# Patient Record
Sex: Female | Born: 1955 | Race: White | Hispanic: No | Smoking: Never smoker
Health system: Southern US, Community
[De-identification: ages and names within clinical notes are randomized; demographics above are authoritative.]

## PROBLEM LIST (undated history)

## (undated) DIAGNOSIS — M3500 Sicca syndrome, unspecified: Secondary | ICD-10-CM

## (undated) DIAGNOSIS — M797 Fibromyalgia: Secondary | ICD-10-CM

## (undated) DIAGNOSIS — Z889 Allergy status to unspecified drugs, medicaments and biological substances status: Secondary | ICD-10-CM

## (undated) HISTORY — PX: TONSILLECTOMY: SUR1361

## (undated) HISTORY — PX: ABDOMINAL HYSTERECTOMY: SHX81

---

## 2004-09-07 ENCOUNTER — Ambulatory Visit: Payer: Self-pay | Admitting: Family Medicine

## 2004-09-13 ENCOUNTER — Ambulatory Visit: Payer: Self-pay | Admitting: Family Medicine

## 2004-10-01 HISTORY — PX: BREAST LUMPECTOMY: SHX2

## 2004-11-08 ENCOUNTER — Ambulatory Visit: Payer: Self-pay | Admitting: Family Medicine

## 2005-01-23 ENCOUNTER — Other Ambulatory Visit: Admission: RE | Admit: 2005-01-23 | Discharge: 2005-01-23 | Payer: Self-pay | Admitting: Obstetrics and Gynecology

## 2005-02-20 ENCOUNTER — Encounter: Admission: RE | Admit: 2005-02-20 | Discharge: 2005-02-20 | Payer: Self-pay | Admitting: Obstetrics and Gynecology

## 2005-02-28 ENCOUNTER — Encounter: Admission: RE | Admit: 2005-02-28 | Discharge: 2005-02-28 | Payer: Self-pay | Admitting: Obstetrics and Gynecology

## 2005-04-19 ENCOUNTER — Ambulatory Visit (HOSPITAL_COMMUNITY): Admission: RE | Admit: 2005-04-19 | Discharge: 2005-04-19 | Payer: Self-pay | Admitting: Surgery

## 2005-04-19 ENCOUNTER — Ambulatory Visit (HOSPITAL_BASED_OUTPATIENT_CLINIC_OR_DEPARTMENT_OTHER): Admission: RE | Admit: 2005-04-19 | Discharge: 2005-04-19 | Payer: Self-pay | Admitting: Surgery

## 2005-04-19 ENCOUNTER — Encounter: Admission: RE | Admit: 2005-04-19 | Discharge: 2005-04-19 | Payer: Self-pay | Admitting: Surgery

## 2005-04-19 ENCOUNTER — Encounter (INDEPENDENT_AMBULATORY_CARE_PROVIDER_SITE_OTHER): Payer: Self-pay | Admitting: *Deleted

## 2005-10-08 ENCOUNTER — Encounter: Admission: RE | Admit: 2005-10-08 | Discharge: 2005-10-08 | Payer: Self-pay | Admitting: Obstetrics and Gynecology

## 2005-12-18 ENCOUNTER — Ambulatory Visit: Payer: Self-pay | Admitting: Family Medicine

## 2005-12-20 ENCOUNTER — Ambulatory Visit: Payer: Self-pay | Admitting: Cardiology

## 2006-03-05 ENCOUNTER — Encounter: Admission: RE | Admit: 2006-03-05 | Discharge: 2006-03-05 | Payer: Self-pay | Admitting: Obstetrics and Gynecology

## 2006-03-19 ENCOUNTER — Ambulatory Visit: Payer: Self-pay | Admitting: Internal Medicine

## 2006-03-22 ENCOUNTER — Ambulatory Visit (HOSPITAL_COMMUNITY): Admission: RE | Admit: 2006-03-22 | Discharge: 2006-03-22 | Payer: Self-pay | Admitting: Internal Medicine

## 2006-04-12 ENCOUNTER — Ambulatory Visit: Payer: Self-pay | Admitting: Internal Medicine

## 2006-06-17 ENCOUNTER — Encounter: Admission: RE | Admit: 2006-06-17 | Discharge: 2006-06-17 | Payer: Self-pay | Admitting: Family Medicine

## 2006-06-17 ENCOUNTER — Ambulatory Visit: Payer: Self-pay | Admitting: Family Medicine

## 2006-08-30 ENCOUNTER — Other Ambulatory Visit: Admission: RE | Admit: 2006-08-30 | Discharge: 2006-08-30 | Payer: Self-pay | Admitting: Obstetrics and Gynecology

## 2006-09-03 ENCOUNTER — Ambulatory Visit: Payer: Self-pay | Admitting: Family Medicine

## 2006-12-13 ENCOUNTER — Ambulatory Visit: Payer: Self-pay | Admitting: Family Medicine

## 2006-12-13 LAB — CONVERTED CEMR LAB
Eosinophils Relative: 2.7 % (ref 0.0–5.0)
HCT: 40.6 % (ref 36.0–46.0)
MCHC: 34.8 g/dL (ref 30.0–36.0)
MCV: 89.4 fL (ref 78.0–100.0)
Monocytes Relative: 12.2 % — ABNORMAL HIGH (ref 3.0–11.0)
Neutro Abs: 4.2 10*3/uL (ref 1.4–7.7)
Neutrophils Relative %: 60.7 % (ref 43.0–77.0)
TSH: 1.6 microintl units/mL (ref 0.35–5.50)

## 2007-01-21 ENCOUNTER — Ambulatory Visit: Payer: Self-pay | Admitting: Family Medicine

## 2007-03-07 ENCOUNTER — Encounter: Admission: RE | Admit: 2007-03-07 | Discharge: 2007-03-07 | Payer: Self-pay | Admitting: Obstetrics and Gynecology

## 2007-04-11 ENCOUNTER — Ambulatory Visit (HOSPITAL_COMMUNITY): Admission: RE | Admit: 2007-04-11 | Discharge: 2007-04-11 | Payer: Self-pay | Admitting: Obstetrics and Gynecology

## 2007-04-11 ENCOUNTER — Encounter (INDEPENDENT_AMBULATORY_CARE_PROVIDER_SITE_OTHER): Payer: Self-pay | Admitting: Obstetrics and Gynecology

## 2007-05-22 DIAGNOSIS — H9319 Tinnitus, unspecified ear: Secondary | ICD-10-CM | POA: Insufficient documentation

## 2007-05-22 DIAGNOSIS — M26609 Unspecified temporomandibular joint disorder, unspecified side: Secondary | ICD-10-CM | POA: Insufficient documentation

## 2007-05-22 DIAGNOSIS — Z87442 Personal history of urinary calculi: Secondary | ICD-10-CM | POA: Insufficient documentation

## 2007-05-22 DIAGNOSIS — IMO0001 Reserved for inherently not codable concepts without codable children: Secondary | ICD-10-CM | POA: Insufficient documentation

## 2007-05-22 DIAGNOSIS — M1990 Unspecified osteoarthritis, unspecified site: Secondary | ICD-10-CM | POA: Insufficient documentation

## 2007-05-22 DIAGNOSIS — Z87898 Personal history of other specified conditions: Secondary | ICD-10-CM | POA: Insufficient documentation

## 2007-05-22 DIAGNOSIS — E785 Hyperlipidemia, unspecified: Secondary | ICD-10-CM | POA: Insufficient documentation

## 2007-05-22 DIAGNOSIS — L738 Other specified follicular disorders: Secondary | ICD-10-CM | POA: Insufficient documentation

## 2007-05-22 DIAGNOSIS — IMO0002 Reserved for concepts with insufficient information to code with codable children: Secondary | ICD-10-CM | POA: Insufficient documentation

## 2007-05-27 ENCOUNTER — Emergency Department (HOSPITAL_COMMUNITY): Admission: EM | Admit: 2007-05-27 | Discharge: 2007-05-27 | Payer: Self-pay | Admitting: Emergency Medicine

## 2007-05-29 ENCOUNTER — Ambulatory Visit: Payer: Self-pay | Admitting: Family Medicine

## 2007-05-29 DIAGNOSIS — S335XXA Sprain of ligaments of lumbar spine, initial encounter: Secondary | ICD-10-CM | POA: Insufficient documentation

## 2007-05-29 DIAGNOSIS — M542 Cervicalgia: Secondary | ICD-10-CM | POA: Insufficient documentation

## 2007-06-13 ENCOUNTER — Ambulatory Visit: Payer: Self-pay | Admitting: Family Medicine

## 2007-06-13 DIAGNOSIS — N949 Unspecified condition associated with female genital organs and menstrual cycle: Secondary | ICD-10-CM | POA: Insufficient documentation

## 2007-06-19 ENCOUNTER — Encounter: Admission: RE | Admit: 2007-06-19 | Discharge: 2007-07-21 | Payer: Self-pay | Admitting: Family Medicine

## 2007-06-19 ENCOUNTER — Encounter: Payer: Self-pay | Admitting: Family Medicine

## 2007-06-19 ENCOUNTER — Telehealth: Payer: Self-pay | Admitting: Family Medicine

## 2007-06-24 ENCOUNTER — Encounter: Payer: Self-pay | Admitting: Family Medicine

## 2007-07-31 ENCOUNTER — Ambulatory Visit: Payer: Self-pay | Admitting: Obstetrics & Gynecology

## 2007-08-04 ENCOUNTER — Ambulatory Visit (HOSPITAL_COMMUNITY): Admission: RE | Admit: 2007-08-04 | Discharge: 2007-08-04 | Payer: Self-pay | Admitting: Gynecology

## 2007-08-14 ENCOUNTER — Ambulatory Visit: Payer: Self-pay | Admitting: Nurse Practitioner

## 2007-08-25 ENCOUNTER — Encounter: Payer: Self-pay | Admitting: Family Medicine

## 2007-08-25 ENCOUNTER — Ambulatory Visit (HOSPITAL_COMMUNITY): Admission: RE | Admit: 2007-08-25 | Discharge: 2007-08-25 | Payer: Self-pay | Admitting: Gynecology

## 2007-10-21 ENCOUNTER — Encounter: Payer: Self-pay | Admitting: Family Medicine

## 2007-11-12 ENCOUNTER — Ambulatory Visit: Payer: Self-pay | Admitting: Family Medicine

## 2007-11-19 ENCOUNTER — Ambulatory Visit: Payer: Self-pay | Admitting: Family Medicine

## 2007-11-19 DIAGNOSIS — R9409 Abnormal results of other function studies of central nervous system: Secondary | ICD-10-CM | POA: Insufficient documentation

## 2007-11-25 ENCOUNTER — Ambulatory Visit: Payer: Self-pay | Admitting: Family Medicine

## 2007-11-25 DIAGNOSIS — R300 Dysuria: Secondary | ICD-10-CM | POA: Insufficient documentation

## 2007-11-25 LAB — CONVERTED CEMR LAB
Bilirubin Urine: NEGATIVE
Ketones, urine, test strip: NEGATIVE
Nitrite: NEGATIVE
Specific Gravity, Urine: 1.015
WBC Urine, dipstick: NEGATIVE

## 2007-11-26 ENCOUNTER — Encounter: Payer: Self-pay | Admitting: Family Medicine

## 2007-11-26 LAB — CONVERTED CEMR LAB
AST: 19 units/L (ref 0–37)
Albumin: 4 g/dL (ref 3.5–5.2)
Basophils Relative: 1.2 % — ABNORMAL HIGH (ref 0.0–1.0)
Bilirubin, Direct: 0.1 mg/dL (ref 0.0–0.3)
Chloride: 104 meq/L (ref 96–112)
Cholesterol: 231 mg/dL (ref 0–200)
Creatinine, Ser: 1 mg/dL (ref 0.4–1.2)
Direct LDL: 161.2 mg/dL
Eosinophils Absolute: 0.2 10*3/uL (ref 0.0–0.6)
Eosinophils Relative: 4.5 % (ref 0.0–5.0)
Monocytes Relative: 10.7 % (ref 3.0–11.0)
Neutrophils Relative %: 54.1 % (ref 43.0–77.0)
Platelets: 207 10*3/uL (ref 150–400)
Sodium: 139 meq/L (ref 135–145)
TSH: 1.91 microintl units/mL (ref 0.35–5.50)
Total Bilirubin: 1.1 mg/dL (ref 0.3–1.2)
Total Protein: 6.8 g/dL (ref 6.0–8.3)
WBC: 4.5 10*3/uL (ref 4.5–10.5)

## 2007-12-03 LAB — CONVERTED CEMR LAB

## 2007-12-05 ENCOUNTER — Telehealth: Payer: Self-pay | Admitting: Family Medicine

## 2008-01-19 ENCOUNTER — Telehealth: Payer: Self-pay | Admitting: Family Medicine

## 2008-01-19 DIAGNOSIS — R948 Abnormal results of function studies of other organs and systems: Secondary | ICD-10-CM | POA: Insufficient documentation

## 2008-01-19 DIAGNOSIS — G43009 Migraine without aura, not intractable, without status migrainosus: Secondary | ICD-10-CM | POA: Insufficient documentation

## 2008-01-21 ENCOUNTER — Ambulatory Visit: Payer: Self-pay | Admitting: Family Medicine

## 2008-01-26 LAB — CONVERTED CEMR LAB: Vit D, 1,25-Dihydroxy: 26 — ABNORMAL LOW (ref 30–89)

## 2008-04-12 ENCOUNTER — Encounter: Admission: RE | Admit: 2008-04-12 | Discharge: 2008-04-12 | Payer: Self-pay | Admitting: Family Medicine

## 2008-04-14 ENCOUNTER — Ambulatory Visit: Payer: Self-pay | Admitting: Obstetrics & Gynecology

## 2008-04-23 ENCOUNTER — Encounter: Payer: Self-pay | Admitting: Family Medicine

## 2008-05-18 ENCOUNTER — Telehealth (INDEPENDENT_AMBULATORY_CARE_PROVIDER_SITE_OTHER): Payer: Self-pay | Admitting: *Deleted

## 2008-05-19 ENCOUNTER — Ambulatory Visit: Payer: Self-pay | Admitting: Family Medicine

## 2008-05-20 LAB — CONVERTED CEMR LAB
AST: 24 units/L (ref 0–37)
HDL: 44.4 mg/dL (ref >39.0)
Total CHOL/HDL Ratio: 5

## 2008-07-15 IMAGING — CR DG HAND COMPLETE 3+V*L*
3 series · 3 of 3 positions shown · non-contrast
Comparison: none

CLINICAL DATA: Pain and swelling of the left third finger.  
LEFT THIRD FINGER:
There is no acute or significant abnormality.  There is a small bone cyst involving the distal aspect of the middle phalanx of the third finger.  This is only about 2 mm in size.  I think this is an incidental finding, unlikely to be clinically significant.  Soft tissues unremarkable.

[view not recorded (1 of 3)]
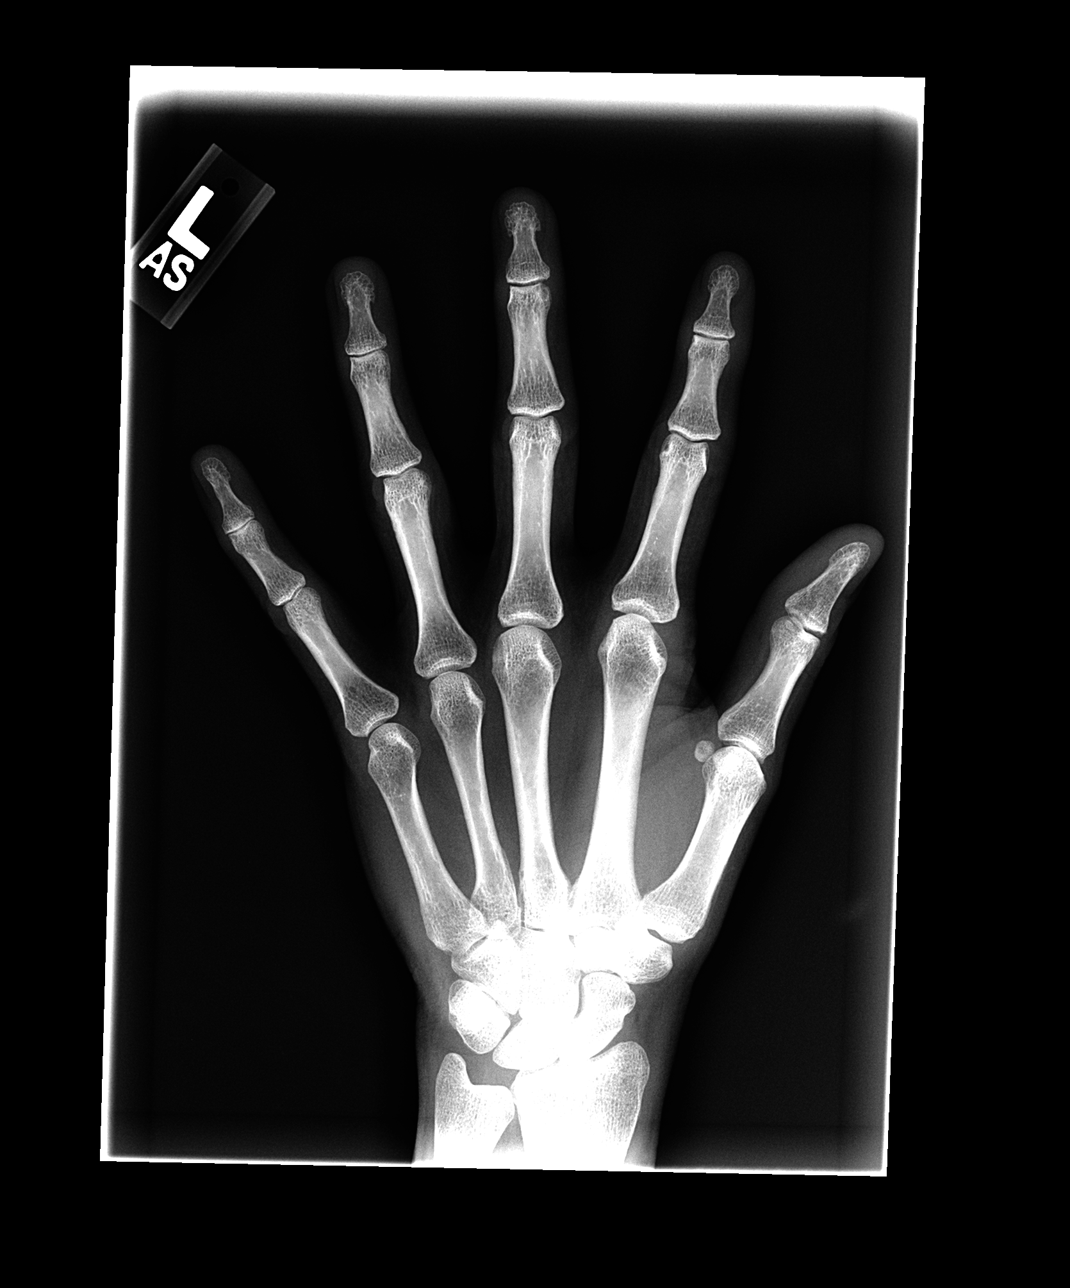

[view not recorded (2 of 3)]
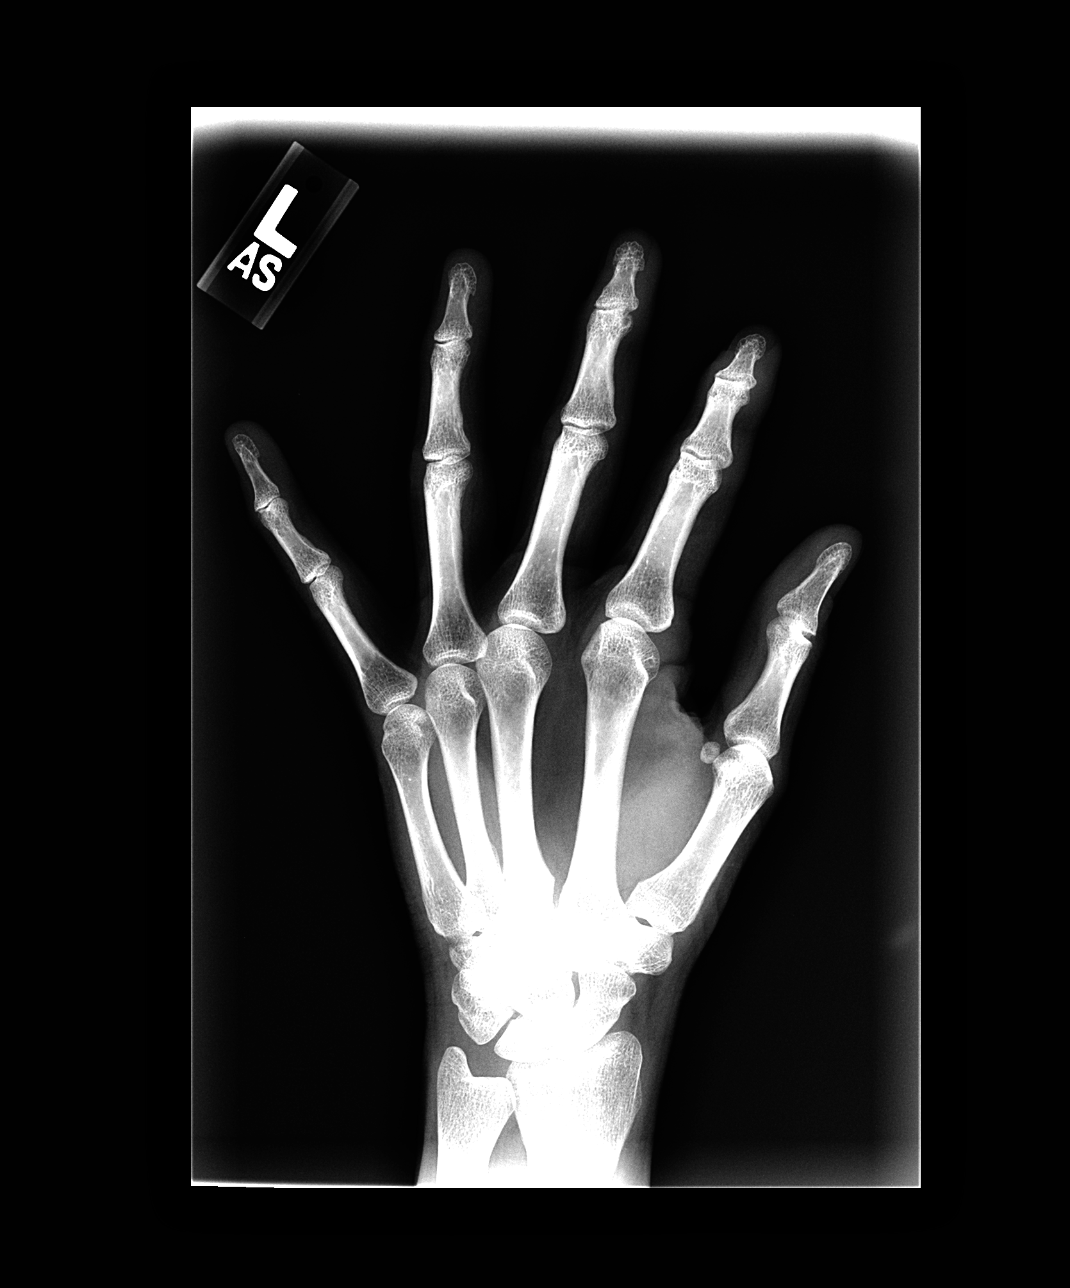

[view not recorded (3 of 3)]
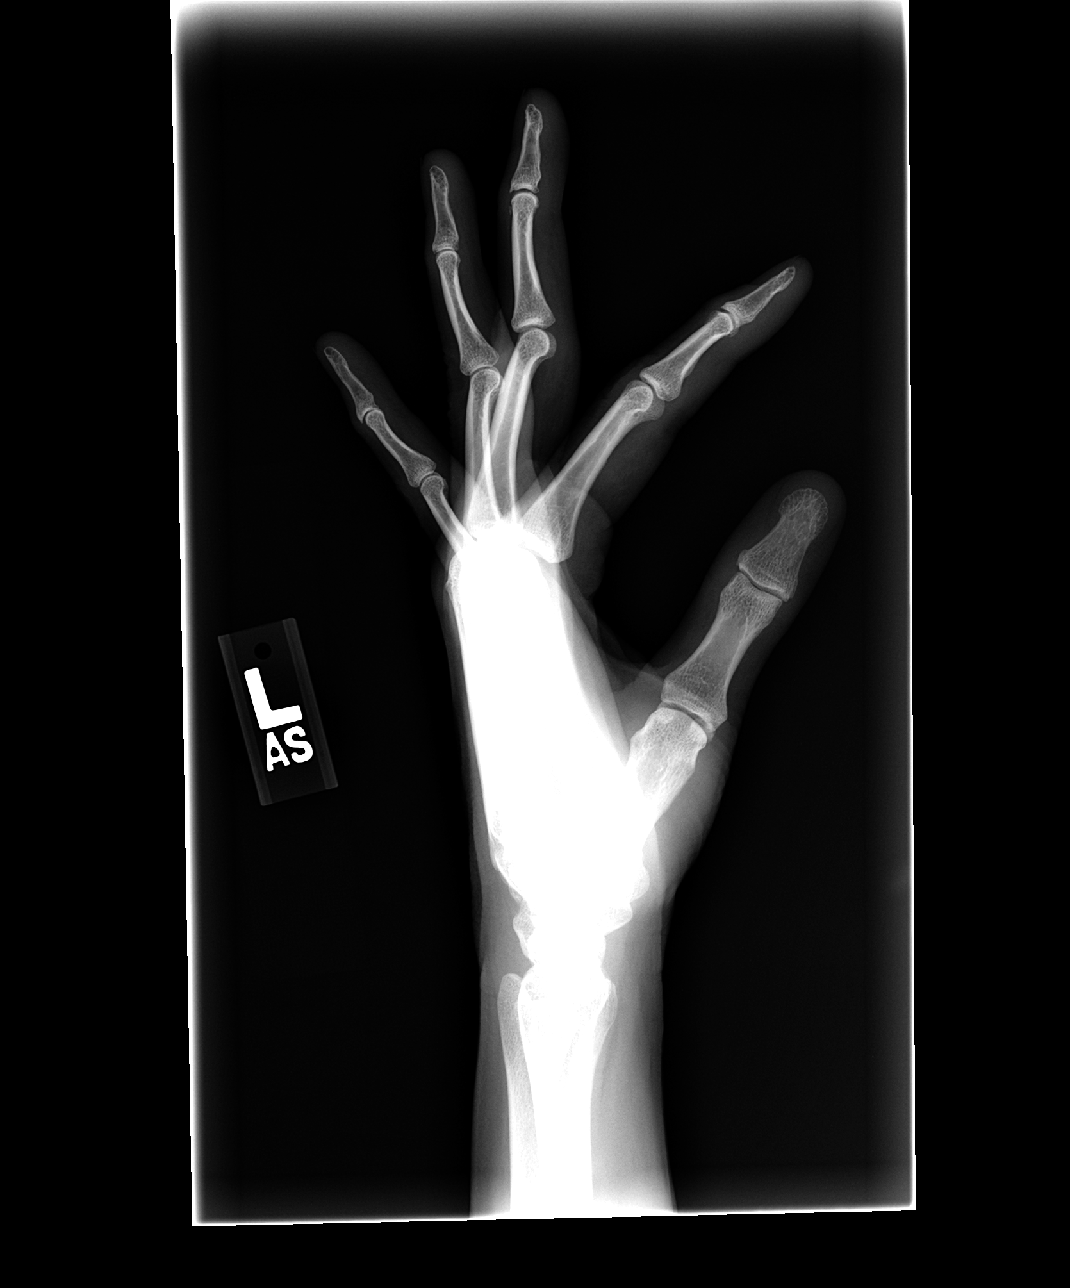

[3 of 3 positions shown; findings below may reference images not displayed]

IMPRESSION: No acute or significant findings.  There is a 2 mm bone cyst involving the distal aspect of the middle phalanx of the third digit.

## 2008-10-26 ENCOUNTER — Telehealth (INDEPENDENT_AMBULATORY_CARE_PROVIDER_SITE_OTHER): Payer: Self-pay | Admitting: *Deleted

## 2008-11-10 ENCOUNTER — Ambulatory Visit: Payer: Self-pay | Admitting: Family Medicine

## 2008-11-10 DIAGNOSIS — E559 Vitamin D deficiency, unspecified: Secondary | ICD-10-CM | POA: Insufficient documentation

## 2008-11-11 ENCOUNTER — Encounter: Payer: Self-pay | Admitting: Family Medicine

## 2008-11-15 LAB — CONVERTED CEMR LAB
Albumin: 4.1 g/dL (ref 3.5–5.2)
BUN: 9 mg/dL (ref 6–23)
Bilirubin, Direct: 0.1 mg/dL (ref 0.0–0.3)
CO2: 30 meq/L (ref 19–32)
Cholesterol: 250 mg/dL (ref 0–200)
Direct LDL: 176.4 mg/dL
GFR calc Af Amer: 75 mL/min
GFR calc non Af Amer: 62 mL/min
Potassium: 4.7 meq/L (ref 3.5–5.1)
Sodium: 140 meq/L (ref 135–145)
Triglycerides: 142 mg/dL (ref 0–149)
Vit D, 1,25-Dihydroxy: 58 (ref 30–89)

## 2008-11-16 ENCOUNTER — Telehealth: Payer: Self-pay | Admitting: Family Medicine

## 2008-11-23 ENCOUNTER — Telehealth: Payer: Self-pay | Admitting: Family Medicine

## 2008-12-06 ENCOUNTER — Ambulatory Visit: Payer: Self-pay | Admitting: Internal Medicine

## 2008-12-20 ENCOUNTER — Ambulatory Visit: Payer: Self-pay | Admitting: Internal Medicine

## 2009-02-01 ENCOUNTER — Ambulatory Visit: Payer: Self-pay | Admitting: Family Medicine

## 2009-02-01 ENCOUNTER — Telehealth: Payer: Self-pay | Admitting: Family Medicine

## 2009-02-01 DIAGNOSIS — L089 Local infection of the skin and subcutaneous tissue, unspecified: Secondary | ICD-10-CM | POA: Insufficient documentation

## 2009-02-16 ENCOUNTER — Ambulatory Visit: Payer: Self-pay | Admitting: Family Medicine

## 2009-02-18 LAB — CONVERTED CEMR LAB
Cholesterol: 165 mg/dL (ref 0–200)
HDL: 42.6 mg/dL (ref >39.00)
Triglycerides: 138 mg/dL (ref 0.0–149.0)

## 2009-03-15 ENCOUNTER — Emergency Department (HOSPITAL_COMMUNITY): Admission: EM | Admit: 2009-03-15 | Discharge: 2009-03-15 | Payer: Self-pay | Admitting: Emergency Medicine

## 2009-05-05 ENCOUNTER — Encounter: Admission: RE | Admit: 2009-05-05 | Discharge: 2009-05-05 | Payer: Self-pay | Admitting: Family Medicine

## 2009-10-01 HISTORY — PX: ANKLE SURGERY: SHX546

## 2010-10-31 NOTE — Progress Notes (Signed)
Summary: [facility]/Connie Dupree PT  [facility]/Connie Dupree PT   Imported By: Eleonore Chiquito 09/01/2007 15:04:43  _____________________________________________________________________  External Attachment:    Type:   Image     Comment:   External Document

## 2011-02-13 NOTE — Assessment & Plan Note (Signed)
NAMEAUDRIE, Rebecca Berger                ACCOUNT NO.:  [id number]   MEDICAL RECORD NO.:  [id number]          PATIENT TYPE:  POB   LOCATION:  [facility]         FACILITY:  [facility]   PHYSICIAN:  Tinnie Gens, MD        DATE OF BIRTH:  17-Oct-1955   DATE OF SERVICE:  11/10/2008                                  CLINIC NOTE   CHIEF COMPLAINT:  Vulvar irritation.   HISTORY OF PRESENT ILLNESS:  The patient is a 55 year old who has  undergone hysterectomy in the past.  She has a history of fibromyalgia  and migraine headaches.  She is menopausal and is on Climara or Vivelle-  Dot.  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  The patient had no biopsy prior  to having this procedure done.  Her past and surgery reports are  reviewed.  She comes back with a couple of growths that have just  started and mildly irritating to her; however, she is not interested in  major surgery again.   PHYSICAL EXAMINATION:  VITAL SIGNS:  On exam today, her vitals are as in  the chart.  GENERAL:  She is a well-developed, well-nourished female in no acute  distress.  GU:  Normal external female genitalia.  She does have a mild thickening  of the left labia majora.  Her labia minora is very small and is white.  There is a couple of other nevi and possible acrochordons noted in the  groin area.   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   PLAN:  Lots of reassurance given to this patient.  There is really no  need to do anything unless they get big enough or they are irritating  given the benign process.  Should she require removal shaving,  desiccation, freezing, any other activity done easily and without too  much trauma, she certainly would not require skinning vulvectomies.  She  will follow up with [place] as needed when she desires further treatment.          ______________________________  Tinnie Gens, MD     TP/MEDQ  D:  11/10/2008  T:  11/10/2008  Job:  [id number]

## 2011-02-13 NOTE — Op Note (Signed)
Rebecca Berger, Rebecca Berger                ACCOUNT NO.:  [id number]   MEDICAL RECORD NO.:  [id number]          PATIENT TYPE:  AMB   LOCATION:  [facility]                           FACILITY:  [facility]   PHYSICIAN:  James A. Ashley Royalty, M.D.DATE OF BIRTH:  1956/09/27   DATE OF PROCEDURE:  04/11/2007  DATE OF DISCHARGE:                               OPERATIVE REPORT   PREOPERATIVE DIAGNOSIS:  1. 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  2. Papule of the right buttocks with desire for removal.   POSTOPERATIVE DIAGNOSIS:  1. 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  2. Papule of the right buttocks with desire for removal.   PROCEDURE:  1. Partial vulvectomy  2. Excision of papule of the right buttock.   SURGEON:  Rudy Jew. Ashley Royalty, M.D.   ANESTHESIA:  General.   ESTIMATED BLOOD LOSS:  Less than 25 mL.   COMPLICATIONS:  None.   PACKS AND DRAINS:  None.   PROCEDURE IN DETAIL:  The patient was taken to the operating room and  prepped and draped in the usual manner for vaginal surgery.  The  excrescences of the labia minora bilaterally were once again observed.  The papule of the right buttocks was once again observed.  The papule  was excised with a 15 blade using an elliptical incision.  The specimen  was submitted to pathology for histologic studies.  The defect was  easily closed with 3-0 Monocryl in a subcuticular fashion.   Attention was then turned to the partial vulvectomy.  The anterior  portion of the labia minora on the right was noted to contain  excrescences that came within approximately 1 to 1.5 cm from the  clitoris.  A portion of the labium in question was excised with  Metzenbaum scissors and submitted to pathology for histologic studies.  The anterior portion of the left labia minora was also involved with the  excrescences consistent with HPV.  The area in question was excised with  Metzenbaum scissors and submitted to pathology for histologic studies.  Hemostasis was obtained  with the Bovie.  The labia were closed on each side with a running  subcuticular suture of 3-0 chromic.  The edges were nicely approximated.  Hemostasis was noted and the procedure terminated.   The patient tolerated the procedure extremely well and was returned to  the recovery room in good condition.      James A. Ashley Royalty, M.D.  Electronically Signed     JAM/MEDQ  D:  04/11/2007  T:  04/12/2007  Job:  [id number]

## 2011-02-13 NOTE — Assessment & Plan Note (Signed)
NAMEMISKI, FELDPAUSCH NO.:  [id number]   MEDICAL RECORD NO.:  [id number]          PATIENT TYPE:  POB   LOCATION:  [facility]         FACILITY:  [facility]   PHYSICIAN:  Elsie Lincoln, MD      DATE OF BIRTH:  [birth year]   DATE OF SERVICE:  07/31/2007                                  CLINIC NOTE   Patient is a 55 year old para 1 female who has been menopausal since age  73 after a hysterectomy for endometriosis.  She has been on hormone  replacement patch since then.  She underwent menopausal symptoms for 5  months prior to going on hormone replacement therapy but recently she is  experiencing menopausal symptoms again with hot flashes and breast  tenderness.  The only explanation I could have for this is possibly  there is an ovarian remnant that she had left during the surgery, now  she is actually undergoing menopause.  Otherwise there needs to be  another explanation for the return of menopausal symptoms, possibly  thyroid or depression.  She denies either of these problems.  The  patient was an old patient of Dr. Ashley Royalty and she has recently had  removal tumors from her labia minora in July 2008 at the [facility] that were benign.  She also is suffering from migraine  headaches, she does have an aura with them where she has vision changes  and has actually had complete loss of vision at one time.  She has never  had imaging of her head which I think would be warranted at this time.  She does have primary care doctor, Dr. Milinda Antis, but I will refer her to  our headache specialist Bonita Quin who can hopefully help her with some of  her issues.  I also think that she should go off the Vivelle because she  has been on it for 19 years and we discussed the [facility]  initiative and the risk of breast cancer, and also that to being on  estrogen and possibly having neurologic symptoms from her migraines I do  not think this would be a good mix.   PAST MEDICAL  HISTORY:  1. Fibromyalgia.  2. Osteoarthritis.  3. Proteinuria as a child.  4. Mitral valve prolapse.  5. High cholesterol.   SURGICAL HISTORY:  1. Lumpectomy with benign breast lump.  2. Foot surgery 2006.  3. Hysterectomy 1990 in [place].  4. Removal of tumors in the labia minora that were benign.   FAMILY HISTORY:  Positive for dad having a heart attack and autoimmune  diseases in her family like lupus, rheumatoid arthritis, stroke,  Sjogren's syndrome and Alzheimer's.   GYN HISTORY:  She has had one miscarriage and one vaginal delivery.  She  has not ever had any abnormal Pap smears.  She had hysterectomy for  endometriosis.  She has never had ovarian cysts, fibroid tumors or  sexually transmitted diseases.   SOCIAL HISTORY:  She has lived in this area for several years and  recently moved from [place].  She lives with her husband, is a  monogamous relationship.  She rarely drinks alcohol and does not smoke  and does drink caffeinated beverages.  She does not use drugs or has not  been sexually abused.   SYSTEMIC REVIEW:  Positive for muscle aches which can be contributed to  her fibromyalgia, she has frequent headaches and problems with vision  which are related which is discussed above.  She has vaginal odor and  itching and was recently self-treated for yeast infection.   MEDICATIONS:  Vivelle-Dot and Lipitor.  She is going to self discontinue  the Lipitor and is discussing this with Dr. Milinda Antis.   SHE HAS MULTIPLE ALLERGIES TO CODEINE, MORPHINE SULFATE AND ALL DYES,  HAS PROBLEM WITH FILLER IN MEDICATIONS.   She is to take calcium however she has had problems with the fillers so  she stopped.  We did discuss that she does need to be on calcium given  that she is menopausal.  She is going to try to go to the organic food  store and get an organic vitamin and organic calcium.   Pulse 86, blood pressure 114/64, weight 157.5, height 5 foot 10.  GENERAL:   Well-nourished, well-developed, no apparent distress.  HEENT:  Normocephalic, atraumatic, good dentition.  Thyroid no masses.  LUNGS:  Clear to auscultation bilaterally.  HEART:  Regular rate and rhythm.  BREASTS:  No masses.  Left breast has evidence of a well-healed biopsy  site, no lymphadenopathy.  ABDOMEN:  Soft, nontender, no organomegaly, no hernia.  Well healed  __________ skin incision from hysterectomy.  GENITALIA:  Tanner 5, there is evidence of yeast on her vulva, labia  minora are well healed.  Urethra are bladder are well suspended.  Vagina  has good length.  There is evidence of yeast in the vagina as well,  vaginal cuff is nontender.  There is a mass midline to left that is  tender.  The patient does have problem with constipation however I do  not know if I am feeling stool in her bowel or if there is a mass so  pelvic ultrasound will be ordered.  EXTREMITIES:  No edema.   ASSESSMENT/PLAN:  A 55 year old female for yearly exam.  1. Pap smear not necessary, the patient has had hysterectomy.  No      history of dysplasia on her cervix.  2. Mammogram is up-to-date.  3. Calcium supplementation talked about.  4. Stop Vivelle and see how she does off the hormones.  5. Refer to migraines specialist.  6. Transvaginal ultrasound to see if there is any issues with any      masses in her pelvis.  7. Return to clinic in a week for results.           ______________________________  Elsie Lincoln, MD     KL/MEDQ  D:  07/31/2007  T:  07/31/2007  Job:  [id number]

## 2011-02-13 NOTE — Assessment & Plan Note (Signed)
NAMEGURNOOR, SLOOP NO.:  [id number]   MEDICAL RECORD NO.:  [id number]          PATIENT TYPE:  POB   LOCATION:  [facility]         FACILITY:  [facility]   PHYSICIAN:  Elsie Lincoln, MD      DATE OF BIRTH:  09-05-1956   DATE OF SERVICE:  04/14/2008                                  CLINIC NOTE   The patient is a 55 year old female who presents for complaints of  vaginal discharge and pain.  The patient is self-treated with Diflucan 3  tablets and 2 over-the-counter cream medications.  She states that the  pain and discharge has gone now.  However, she is leaving to go to  [place] and wanted to make sure that nothing is going on.  She also would  like a prescription for Diflucan in case symptoms recur again.  The  patient had a hysterectomy approximately 20 years ago for endometriosis  and has been menopausal since.  She is on Vivelle.   PAST MEDICAL HISTORY:  Update she did consult a neurologist for the MRI  changes associated with migraine.  Her fibromyalgia is much improved  after her iron deficiency anemia was corrected.   ALLERGIES:  CODEINE, MORPHINE, SULFA, DYES, and FILLERS IN PILLS AND  VITAMINS.   MEDICATIONS:  Maxalt and Vivelle-Dot.   PHYSICAL EXAMINATION:  VITALS:  Pulse 82, blood pressure 100/62, weight  159, and height 5 feet 10 inches.  GENERAL:  Well nourished, well developed, in no apparent distress.  ABDOMEN:  Soft and nontender.  GENITALIA:  Tanner 5.  Vagina pink.  Normal rugae.  Vaginal vault  intact.  A small amount of physiologic discharge.  Bimanual, no adnexal  masses noted, no discomfort.  Pelvic organs surgically absent.   ASSESSMENT/PLAN:  A 55 year old female with vaginal discharge, now  resolved.  1. Wet prep showed very few cells or lactobacillus.  The patient was      encouraged to only use the Diflucan, not any intravaginal      preparations.  if she is out town and she has some discomfort,      please take the pills;  if it is not better, then she needs to come      to make sure there is nothing else going on.  2. Second wet prep sent out to the labs.  Our microscope was difficult      to use today.  3. The patient is 55 year old and understands that colonoscopy is      necessary.  However, she has chosen not to perform one and her      mammogram is up to date.  She is due for a Pap smear every 3 years.      She does not know when she last had one, but she can make an      appointment for a physical exam whenever she is ready.           ______________________________  Elsie Lincoln, MD     KL/MEDQ  D:  04/14/2008  T:  04/14/2008  Job:  [id number]

## 2011-02-13 NOTE — H&P (Signed)
NAMEINDIE, Rebecca Berger                ACCOUNT NO.:  [id number]   MEDICAL RECORD NO.:  [id number]          PATIENT TYPE:  AMB   LOCATION:  [facility]                           FACILITY:  [facility]   PHYSICIAN:  James A. Ashley Royalty, M.D.DATE OF BIRTH:  Feb 27, 1956   DATE OF ADMISSION:  04/11/2007  DATE OF DISCHARGE:                              HISTORY & PHYSICAL   This is a 55 year old gravida 2, para 1, AB 1 who is status post total  abdominal hysterectomy and bilateral salpingo-oophorectomy in 1990.  She  presented June 8 complaining of abnormal growths in her labia  bilaterally.  Subsequent exam revealed vulvar papules on the labia  minora bilaterally consistent with HPV changes.  The patient requests  surgical removal.   MEDICATIONS:  1. Vivelle-Dot.  2. Lipitor.   PAST MEDICAL HISTORY:  Fibromyalgia, hypercholesterolemia.   PAST SURGICAL HISTORY:  TAH-BSO as above, laparoscopy x3 all for  endometriosis, foot surgery, tonsillectomy, removal of skin nevi per  dermatology.   ALLERGIES:  SULFA, MORPHINE, DYES and COLOR IN MEDICATIONS.   FAMILY HISTORY:  Noncontributory.   SOCIAL HISTORY:  The patient denies use of alcohol or significant  alcohol.   REVIEW OF SYSTEMS:  Noncontributory.   PHYSICAL EXAMINATION:  GENERAL:  A well-developed, well-nourished  pleasant black female in no acute distress.  VITAL SIGNS:  Stable.  CHEST:  Lungs are clear.  CARDIAC:  Regular rate and rhythm.  ABDOMEN:  Soft and nontender.  MUSCULOSKELETAL:  No CVA tenderness.  PELVIC:  Please see most recent office evaluation.  External genitalia:  There are some HPV like changes emanating from the anterior aspect of  the right labia minus extending to within approximately 1 to 1.5 cm from  the clitoris.  These changes were shown to the patient.  In addition the  patient points out a papule on her right buttocks of which she requests  removal.  In addition on the left labia minus there are additional HPV  type  projections anteriorly including at least one which is greater than  1 cm in length.  The lesions on the patient's left side do not approach  the clitoral area as closely as those on the right.  The remainder of  the vulva is unremarkable.  The vagina is without lesions.  The cervix  and uterus are surgically absent.  No adnexal masses could be  appreciated.   IMPRESSION:  1. Status post total abdominal hysterectomy, bilateral salpingo-      oophorectomy.  2. Fibromyalgia.  3. Hypercholesterolemia.  4. 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  5. Papule on the right buttocks which the patient desires to be      removed.   PLAN:  Excision of the lesions consistent with HPV from the vulva as  well as removal of the papule from the right buttock area per patient  request.  Risks, benefits, complications, and alternatives were fully  discussed with the patient.  She states she understands and consents.  Questions invited and answered.  James A. Ashley Royalty, M.D.  Electronically Signed     JAM/MEDQ  D:  04/11/2007  T:  04/11/2007  Job:  [id number]

## 2011-02-13 NOTE — Assessment & Plan Note (Signed)
NAMEANGI, GOODELL                ACCOUNT NO.:  [id number]   MEDICAL RECORD NO.:  [id number]          PATIENT TYPE:  POB   LOCATION:  [facility]         FACILITY:  [facility]   PHYSICIAN:  Tinnie Gens, MD        DATE OF BIRTH:  [birth year]   DATE OF SERVICE:  11/12/2007                                  CLINIC NOTE   CHIEF COMPLAINT:  Discuss hormone replacement therapy.   HISTORY OF PRESENT ILLNESS:  The patient is a 55 year old female who has  undergone hysterectomy and surgical menopause some time ago and has  since been on hormone replacement therapy. She has been on pills for a  long time. I believe she has an ALLERGY TO THE FILLERS IN MEDICATION and  does not like to be on any prescription drug.  She has developed  migraines with hormone replacement therapy and went on a low-dose  Vivelle Dot to see if that would help her not have symptoms of muscle  pain.  The Vivelle worked really well in terms of relieving that.  However, she has migraine headache and headaches related to that.  She  notes that she tried stop her Vivelle Dot. However, she had too many  neurologic symptoms and almost lost her job so is unwilling to come off  her hormones completely. However, she would like to change from the  Vivelle Dot to something different and see if that helps decrease her  migraine headaches.   PHYSICAL EXAMINATION:  Her vitals are as noted in the chart.  She well-  nourished female in no acute distress.   IMPRESSION:  1. Migraine headache. The patient is status post MRI which shows some      frontal matter white changes and she has not seen neurology.      Specifically these changes did not appear to be in a pattern      consistent with demyelinating disease.  2. Allergic rhinitis.  3. Menopausal.   PLAN:  Will switch to Climara patch.  She will try that. She does have  problems with adhesions and it may be that does not work for her.  I  have given her sample of VivaGel as  well to try. Instructions are on the  box.  If the Climara does not work she can try that. Will follow up in 1  month to see how she is doing in terms of symptomatology and she is  going  for a physical with a medical doctor next month as well.  He will  follow up for her elevated cholesterol.  The patient is also planning to  join the gym. Last mammogram was in May, which was within normal limits.           ______________________________  Tinnie Gens, MD     TP/MEDQ  D:  11/12/2007  T:  11/13/2007  Job:  [id number]

## 2011-02-13 NOTE — Assessment & Plan Note (Signed)
NAMEALIVYA, Rebecca Berger                ACCOUNT NO.:  [id number]   MEDICAL RECORD NO.:  [id number]          PATIENT TYPE:  POB   LOCATION:  [facility]         FACILITY:  [facility]   PHYSICIAN:  Ginger Carne, MD DATE OF BIRTH:  03/18/1956   DATE OF SERVICE:  08/14/2007                                  CLINIC NOTE   The patient comes to the office today with complaints of a migraine  headache.  She does have a history of migraine headaches with aura for  approximately 30 years.  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  Her headaches are moderate  to severe almost daily.  She does have some difficulties at times  working.  She takes Excedrin Migraine for her headaches.  She has taken  Maxalt in the past and that has worked very well. She was told by a  physician at some point that she should not take Maxalt and that is  somewhat unclear at this point.  She is currently not on any  preventatives.  She does have significant issues with taking medications  and has side effects from many medicines and their fillers.  We did talk  about the factors that trigger her headaches.  She does admit that she  has some moderate stress in her life.  She also has some difficulty with  sleep.  She takes Benadryl almost nightly.  She falls asleep relatively  quickly but always sleeps for approximately five hours and then she is  awake.  She does feel that she needs approximately seven to eight hours  of sleep per night.  From a hormonal standpoint, she has had a complete  hysterectomy at 55 years old for endometriosis.  She has been on the  Vivelle patch as well as other forms of hormone and was recently in the  last two weeks discontinued due to the length of time that she has been  on these.  She overall at this point feels that her headaches are  somewhat better in the last two  to three weeks.  She does have a history  of depression that sounds off and on and situational.  She does not feel  that she has any depression symptoms now.   PHYSICAL EXAMINATION:  GENERAL:  A well-developed, well-nourished, 54-  year-old Caucasian in no acute distress.  HEENT:  Head is normocephalic and atraumatic.  CARDIAC:  Regular rate and rhythm.  LUNGS:  Clear bilaterally without rales, rhonchi, or wheezes.  NEUROLOGIC:  She is intact, she is well-coordinated, speaks fluently,  she is somewhat anxious, all her gross motor nerves are intact.   ASSESSMENT AND PLAN:  Migraine with aura:  We discussed at length her  starting on a headache prevention.  She is quite hesitant to consider  medication management of her headaches as she has such a history of  difficulty with medications.  She is willing to restart the Maxalt since  she has taken that in the past and has done well with that.  We also  discussed a magnetic resonance imaging (MRI) for reassurance.  She has  never had a magnetic resonance imaging and feels that this would give  her a peace of mind as far as her headaches were concerned.  She is also  interested in Botox since she was given a prescription for Botox to get  filled with the pharmacy and return here for injection if her insurance  pays for that.  She is also interested in improving her nutrition and  her exercise status,  and we will talk to a nutritionist and join a yoga institute.  She is  also willing to consider Lyrica as she would get a second benefit from  that as far as her fibromyalgia goes.  She will follow up in one month.      Remonia Richter, NP    ______________________________  Ginger Carne, MD    LR/MEDQ  D:  08/14/2007  T:  08/14/2007  Job:  [id number]

## 2011-02-16 NOTE — Op Note (Signed)
NAMESKYLINN, Rebecca Berger                ACCOUNT NO.:  [id number]   MEDICAL RECORD NO.:  [id number]          PATIENT TYPE:  AMB   LOCATION:  DSC                          FACILITY:  [facility]   PHYSICIAN:  Sandria Bales. Ezzard Standing, M.D.  DATE OF BIRTH:  1956-02-09   DATE OF PROCEDURE:  04/19/2005  DATE OF DISCHARGE:                                 OPERATIVE REPORT   PREOPERATIVE DIAGNOSIS:  Mass left breast 3 o'clock position.   POSTOPERATIVE DIAGNOSIS:  Mass left breast 3 o'clock position.   PROCEDURE:  Left breast excisional biopsy with needle localization.   SURGEON:  Sandria Bales. Ezzard Standing, M.D.   ANESTHESIA:  MAC with 17 mL 1% Xylocaine.   COMPLICATIONS:  None.   ESTIMATED BLOOD LOSS:  About 30 mL.   DRAINS:  None.   INDICATIONS FOR PROCEDURE:  Ms. Prout is a 55 year old white female who has  had a mass identified at the 3 o'clock position of her left breast.  I spoke  to her about needle biopsy, core biopsy versus open biopsy of this.  She was  insistent on having an open excisional biopsy of this area.  Since I could  not palpate this, Dr. Rosalie Gums has done a needle localization under  ultrasound preoperatively.  In fact, she wondered whether this was a little  cyst in that the mass tended to disappear as she was watching it.   DESCRIPTION OF PROCEDURE:  The patient presented to the [facility] with a wire coming out of her left breast at the 3 o'clock position.  The wire was cut, her left chest was marked, and time out confirmed left  breast biopsy on the needle localization.  Her breast was prepped with  Betadine solution and sterilely draped.   She was given MAC anesthesia.  I outlined or marked the skin over where I  thought this mass was.  I then infiltrated this with about 17 mL of 1%  Xylocaine.  I then excised the block of breast tissue which is approximately  4-5 cm with a depth of about 3-4 cm onto the chest wall, I thought I  identified where Dr. Manson Passey had marked the  skin in the center piece of  tissue.  I sent this as specimen mammogram of which there was no mass seen,  however, ultrasound report is pending.  I really think I got it seeing that  any further tissue would not be improvement.  I then irrigated the  wound and closed the subcutaneous tissue with 3-0 Vicryl suture, the skin  with 5-0 Vicryl suture, painted the wound with tincture of Benzoin and Steri-  Strips.   Sponge and needle counts were correct at the end of the case.  [Mass was  removed confirmed on US]       DHN/MEDQ  D:  04/19/2005  T:  04/19/2005  Job:  [id number]   cc:   Marne A. Milinda Antis, M.D. [facility]   Fayrene Fearing A. Ashley Royalty, M.D.  [address]  [place], [place] [address]  Fax: [phone]   Norva Pavlov, M.D.  [address], [address]  [place]  [place] [address]  Fax: [phone]

## 2011-07-17 LAB — ABO/RH: ABO/RH(D): A POS

## 2011-07-17 LAB — CBC
HCT: 41.9
Hemoglobin: 14.2
MCHC: 33.8
MCV: 87.7
RBC: 4.78
RDW: 12.7

## 2011-07-17 LAB — TYPE AND SCREEN: ABO/RH(D): A POS

## 2014-10-01 HISTORY — PX: SHOULDER SURGERY: SHX246

## 2015-02-15 ENCOUNTER — Encounter: Payer: Self-pay | Admitting: Internal Medicine

## 2019-01-26 ENCOUNTER — Encounter: Payer: Self-pay | Admitting: Internal Medicine

## 2021-01-12 ENCOUNTER — Other Ambulatory Visit: Payer: Self-pay

## 2021-01-12 ENCOUNTER — Ambulatory Visit
Admission: EM | Admit: 2021-01-12 | Discharge: 2021-01-12 | Disposition: A | Discharge status: Home / Self Care | Payer: BC Managed Care – PPO | Attending: Family Medicine | Admitting: Family Medicine

## 2021-01-12 DIAGNOSIS — M5441 Lumbago with sciatica, right side: Secondary | ICD-10-CM

## 2021-01-12 HISTORY — DX: Allergy status to unspecified drugs, medicaments and biological substances: Z88.9

## 2021-01-12 HISTORY — DX: Sjogren syndrome, unspecified: M35.00

## 2021-01-12 HISTORY — DX: Fibromyalgia: M79.7

## 2021-01-12 NOTE — Discharge Instructions (Signed)
Rest, ice.  OTC Ibuprofen.  Take care  Dr. Adriana Simas

## 2021-01-12 NOTE — ED Provider Notes (Signed)
[facility]    CSN: [id number] Arrival date & time: 01/12/21  1543      History   Chief Complaint Chief Complaint  Patient presents with  . Back Pain   HPI  65 year old female presents with the above complaint.  Patient reports that she was going to [employer] and took a step and developed sudden onset pain in the right buttock.  She states that she was unable to move for a few minutes but subsequently her pain lessened and she was able to ambulate and came here for evaluation.  Her pain is currently improved and is 3/10 in severity.  Localized primarily to the right buttock.  Denies any numbness or paresthesias.  No saddle anesthesia or incontinence.  No medications or interventions tried.  No other complaints.  Past Medical History:  Diagnosis Date  . Fibromyalgia   . Multiple allergies   . Sjogren's syndrome [facility])     Patient Active Problem List   Diagnosis Date Noted  . INFECTION, SKIN AND SOFT TISSUE 02/01/2009  . VITAMIN D DEFICIENCY 11/10/2008  . MIGRAINE, COMMON 01/19/2008  . NONSPECIFIC ABNORM RESULTS OTH SPEC FUNCT STUDY 01/19/2008  . DYSURIA 11/25/2007  . MRI, BRAIN, ABNORMAL 11/19/2007  . SYMP ASSOCIATED W/FEMALE GENITAL ORGANS NOS 06/13/2007  . NECK PAIN 05/29/2007  . LUMBAR STRAIN 05/29/2007  . HYPERLIPIDEMIA 05/22/2007  . TINNITUS 05/22/2007  . 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 FOLLICULITIS 05/22/2007  . OSTEOARTHRITIS 05/22/2007  . DEGENERATIVE DISC DISEASE 05/22/2007  . FIBROMYALGIA 05/22/2007  . NEPHROLITHIASIS, HX OF 05/22/2007  . MIGRAINE, OPHTHALMIC, HX OF 05/22/2007    Past Surgical History:  Procedure Laterality Date  . ABDOMINAL HYSTERECTOMY    . ANKLE SURGERY Right 2011  . SHOULDER SURGERY Right 2016  . TONSILLECTOMY      OB History   No obstetric history on file.      Home Medications    Prior to Admission medications   Medication Sig Start Date End Date Taking? Authorizing Provider  buPROPion  (WELLBUTRIN XL) 300 MG 24 hr tablet bupropion HCl XL 300 mg 24 hr tablet, extended release  TAKE 1 TABLET BY MOUTH EVERY DAY 05/17/20  Yes [provider]  diphenhydrAMINE (BENADRYL) 50 MG capsule Take by mouth.   Yes [provider]  EPINEPHrine 0.3 mg/0.3 mL IJ SOAJ injection Inject into the skin. 01/13/18  Yes [provider]  estradiol (VIVELLE-DOT) 0.05 MG/24HR patch estradiol 0.05 mg/24 hr semiweekly transdermal patch 12/10/19  Yes [provider]    Family History No family history on file.  Social History Social History   Tobacco Use  . Smoking status: Never Smoker  . Smokeless tobacco: Never Used  Vaping Use  . Vaping Use: Never used  Substance Use Topics  . Alcohol use: Yes  . Drug use: Never     Allergies   Other, Red dye, Sodium nitrite, Sulfites, Yellow dyes (non-tartrazine), Amoxicillin, Atorvastatin, Gluten meal, Hydrocodone-acetaminophen, Morphine, Sulfonamide derivatives, and Sulfacetamide sodium   Review of Systems Review of Systems  Constitutional: Negative.   Musculoskeletal: Positive for back pain.   Physical Exam Triage Vital Signs ED Triage Vitals  Enc Vitals Group     BP 01/12/21 1606 125/83     Pulse Rate 01/12/21 1606 86     Resp 01/12/21 1606 18     Temp 01/12/21 1606 98.6 F (37 C)     Temp Source 01/12/21 1606 Oral     SpO2 01/12/21 1606 98 %  Weight 01/12/21 1558 180 lb (81.6 kg)     Height 01/12/21 1558 5\' 9"  (1.753 m)     Head Circumference --      Peak Flow --      Pain Score 01/12/21 1557 3     Pain Loc --      Pain Edu? --      Excl. in GC? --    Updated Vital Signs BP 125/83 (BP Location: Left Arm)   Pulse 86   Temp 98.6 F (37 C) (Oral)   Resp 18   Ht 5\' 9"  (1.753 m)   Wt 81.6 kg   SpO2 98%   BMI 26.58 kg/m   Visual Acuity Right Eye Distance:   Left Eye Distance:   Bilateral Distance:    Right Eye Near:   Left Eye Near:    Bilateral Near:     Physical Exam Vitals and  nursing note reviewed.  Constitutional:      General: She is not in acute distress.    Appearance: Normal appearance. She is not ill-appearing.  HENT:     Head: Normocephalic and atraumatic.  Eyes:     General:        Right eye: No discharge.        Left eye: No discharge.     Conjunctiva/sclera: Conjunctivae normal.  Cardiovascular:     Rate and Rhythm: Normal rate and regular rhythm.  Pulmonary:     Effort: Pulmonary effort is normal.     Breath sounds: Normal breath sounds. No wheezing, rhonchi or rales.  Musculoskeletal:     Comments: No discrete tenderness of the low back.   Neurological:     Mental Status: She is alert.  Psychiatric:        Mood and Affect: Mood normal.        Behavior: Behavior normal.    UC Treatments / Results  Labs (all labs ordered are listed, but only abnormal results are displayed) Labs Reviewed - No data to display  EKG   Radiology No results found.  Procedures Procedures (including critical care time)  Medications Ordered in UC Medications - No data to display  Initial Impression / Assessment and Plan / UC Course  I have reviewed the triage vital signs and the nursing notes.  Pertinent labs & imaging results that were available during my care of the patient were reviewed by me and considered in my medical decision making (see chart for details).    65 year old female presents with acute low back pain.  She is much improved currently and doing well.  Advised rest, ice, and over-the-counter ibuprofen.  Supportive care.  Final Clinical Impressions(s) / UC Diagnoses   Final diagnoses:  Acute right-sided low back pain with right-sided sciatica     Discharge Instructions     Rest, ice.  OTC Ibuprofen.  Take care  Dr.    ED Prescriptions    None     PDMP not reviewed this encounter.   66, Adriana Simas 01/12/21 1645

## 2021-01-12 NOTE — ED Triage Notes (Addendum)
Pt c/o sudden onset of back pain. She states she bent down to try to get a grocery basket and had an intense and sharp pain on her right buttock, she denies any radiating pain. She does report history of lower back pain but states the is different. She reports the initial pain was so intense she felt she would pass out. She was unable to walk for several minutes. Pt denies any pain down her legs, numbness, tingling or other symptoms.

## 2021-05-22 ENCOUNTER — Other Ambulatory Visit: Payer: Self-pay | Admitting: Gastroenterology

## 2021-05-22 DIAGNOSIS — R1011 Right upper quadrant pain: Secondary | ICD-10-CM

## 2021-05-22 DIAGNOSIS — R1013 Epigastric pain: Secondary | ICD-10-CM

## 2021-05-31 ENCOUNTER — Ambulatory Visit
Admission: RE | Admit: 2021-05-31 | Discharge: 2021-05-31 | Disposition: A | Discharge status: Home / Self Care | Payer: BC Managed Care – PPO | Source: Ambulatory Visit | Attending: Gastroenterology | Admitting: Gastroenterology

## 2021-05-31 ENCOUNTER — Other Ambulatory Visit: Payer: Self-pay

## 2021-05-31 DIAGNOSIS — R1013 Epigastric pain: Secondary | ICD-10-CM | POA: Insufficient documentation

## 2021-05-31 DIAGNOSIS — R1011 Right upper quadrant pain: Secondary | ICD-10-CM | POA: Diagnosis present

## 2021-05-31 MED ORDER — IOHEXOL 300 MG/ML  SOLN
150.0000 mL | Freq: Once | INTRAMUSCULAR | Status: AC | PRN
  Administered 2021-05-31: 100 mL via INTRAVENOUS

## 2021-09-12 ENCOUNTER — Ambulatory Visit
Admission: RE | Admit: 2021-09-12 | Discharge: 2021-09-12 | Disposition: A | Discharge status: Home / Self Care | Payer: BC Managed Care – PPO | Source: Ambulatory Visit | Attending: Emergency Medicine | Admitting: Emergency Medicine

## 2021-09-12 ENCOUNTER — Other Ambulatory Visit: Payer: Self-pay

## 2021-09-12 ENCOUNTER — Ambulatory Visit (INDEPENDENT_AMBULATORY_CARE_PROVIDER_SITE_OTHER): Payer: BC Managed Care – PPO

## 2021-09-12 VITALS — BP 143/89 | HR 88 | Temp 98.9°F | Resp 18

## 2021-09-12 DIAGNOSIS — R0602 Shortness of breath: Secondary | ICD-10-CM | POA: Diagnosis not present

## 2021-09-12 DIAGNOSIS — R059 Cough, unspecified: Secondary | ICD-10-CM

## 2021-09-12 DIAGNOSIS — R051 Acute cough: Secondary | ICD-10-CM

## 2021-09-12 MED ORDER — PREDNISONE 10 MG (21) PO TBPK: ORAL_TABLET | Freq: Every day | ORAL | 0 refills | Status: DC

## 2021-09-12 NOTE — ED Triage Notes (Signed)
Pt here with cough x 2 weeks and loss of appetite x 1 week.

## 2021-09-12 NOTE — Discharge Instructions (Addendum)
Take the prednisone as directed.  Follow up with your primary care provider if your symptoms are not improving.    

## 2021-09-12 NOTE — ED Provider Notes (Signed)
Rebecca Berger    CSN: [id number] Arrival date & time: 09/12/21  1248      History   Chief Complaint Chief Complaint  Patient presents with   Cough    HPI Rebecca Berger is a 65 y.o. female.  Patient presents with 6-day history of cough, chest congestion, and mild shortness of breath which are getting worse.  She also reports decreased appetite.  Her throat is sore with coughing.  She had a fever on the first day of her symptoms but none since.  She denies rash, vomiting, diarrhea, or other symptoms.  Her medical history includes fibromyalgia, Sjogren's syndrome, migraine headaches.  The history is provided by the patient and medical records.   Past Medical History:  Diagnosis Date   Fibromyalgia    Multiple allergies    Sjogren's syndrome [facility])     Patient Active Problem List   Diagnosis Date Noted   INFECTION, SKIN AND SOFT TISSUE 02/01/2009   VITAMIN D DEFICIENCY 11/10/2008   MIGRAINE, COMMON 01/19/2008   NONSPECIFIC ABNORM RESULTS OTH SPEC FUNCT STUDY 01/19/2008   DYSURIA 11/25/2007   MRI, BRAIN, ABNORMAL 11/19/2007   SYMP ASSOCIATED W/FEMALE GENITAL ORGANS NOS 06/13/2007   NECK PAIN 05/29/2007   LUMBAR STRAIN 05/29/2007   HYPERLIPIDEMIA 05/22/2007   TINNITUS 05/22/2007   TEMPOROMANDIBULAR JOINT DISORDER 05/22/2007   FOLLICULITIS 05/22/2007   OSTEOARTHRITIS 05/22/2007   DEGENERATIVE DISC DISEASE 05/22/2007   FIBROMYALGIA 05/22/2007   NEPHROLITHIASIS, HX OF 05/22/2007   MIGRAINE, OPHTHALMIC, HX OF 05/22/2007    Past Surgical History:  Procedure Laterality Date   ABDOMINAL HYSTERECTOMY     ANKLE SURGERY Right 2011   SHOULDER SURGERY Right 2016   TONSILLECTOMY      OB History   No obstetric history on file.      Home Medications    Prior to Admission medications   Medication Sig Start Date End Date Taking? Authorizing Provider  predniSONE (STERAPRED UNI-PAK 21 TAB) 10 MG (21) TBPK tablet Take by mouth daily. As directed 09/12/21  Yes Mickie Bail, NP  buPROPion (WELLBUTRIN XL) 300 MG 24 hr tablet bupropion HCl XL 300 mg 24 hr tablet, extended release  TAKE 1 TABLET BY MOUTH EVERY DAY 05/17/20   [provider]  diphenhydrAMINE (BENADRYL) 50 MG capsule Take by mouth.    [provider]  EPINEPHrine 0.3 mg/0.3 mL IJ SOAJ injection Inject into the skin. 01/13/18   [provider]  estradiol (VIVELLE-DOT) 0.05 MG/24HR patch estradiol 0.05 mg/24 hr semiweekly transdermal patch 12/10/19   [provider]    Family History History reviewed. No pertinent family history.  Social History Social History   Tobacco Use   Smoking status: Never   Smokeless tobacco: Never  Vaping Use   Vaping Use: Never used  Substance Use Topics   Alcohol use: Yes   Drug use: Never     Allergies   Other, Red dye, Sodium nitrite, Sulfites, Yellow dyes (non-tartrazine), Amoxicillin, Atorvastatin, Gluten meal, Hydrocodone-acetaminophen, Morphine, Sulfonamide derivatives, and Sulfacetamide sodium   Review of Systems Review of Systems  Constitutional:  Negative for chills and fever.  HENT:  Positive for sore throat. Negative for ear pain.   Respiratory:  Positive for cough and shortness of breath.   Cardiovascular:  Negative for chest pain and palpitations.  Gastrointestinal:  Negative for diarrhea and vomiting.  Skin:  Negative for color change and rash.  All other systems reviewed and are negative.   Physical Exam Triage Vital  Signs ED Triage Vitals  Enc Vitals Group     BP 09/12/21 1304 (!) 143/89     Pulse Rate 09/12/21 1304 88     Resp 09/12/21 1304 18     Temp 09/12/21 1304 98.9 F (37.2 C)     Temp Source 09/12/21 1304 Oral     SpO2 09/12/21 1304 95 %     Weight --      Height --      Head Circumference --      Peak Flow --      Pain Score 09/12/21 1306 3     Pain Loc --      Pain Edu? --      Excl. in GC? --    No data found.  Updated Vital Signs BP (!) 143/89 (BP Location: Left Arm)     Pulse 88    Temp 98.9 F (37.2 C) (Oral)    Resp 18    SpO2 95%   Visual Acuity Right Eye Distance:   Left Eye Distance:   Bilateral Distance:    Right Eye Near:   Left Eye Near:    Bilateral Near:     Physical Exam Vitals and nursing note reviewed.  Constitutional:      General: She is not in acute distress.    Appearance: She is well-developed. She is not ill-appearing.  HENT:     Right Ear: Tympanic membrane normal.     Left Ear: Tympanic membrane normal.     Nose: Nose normal.     Mouth/Throat:     Mouth: Mucous membranes are moist.     Pharynx: Oropharynx is clear.  Cardiovascular:     Rate and Rhythm: Normal rate and regular rhythm.     Heart sounds: Normal heart sounds.  Pulmonary:     Effort: Pulmonary effort is normal. No respiratory distress.     Breath sounds: Normal breath sounds.  Musculoskeletal:     Cervical back: Neck supple.  Skin:    General: Skin is warm and dry.  Neurological:     Mental Status: She is alert.  Psychiatric:        Mood and Affect: Mood normal.        Behavior: Behavior normal.     UC Treatments / Results  Labs (all labs ordered are listed, but only abnormal results are displayed) Labs Reviewed - No data to display  EKG   Radiology DG Chest 2 View  Result Date: 09/12/2021 CLINICAL DATA:  Cough and shortness of breath for 6 days. EXAM: CHEST - 2 VIEW COMPARISON:  None. FINDINGS: The cardiac silhouette, mediastinal and hilar contours are normal. The lungs are clear of an acute process. No pleural effusions or pulmonary lesions. The bony thorax is intact. IMPRESSION: No acute cardiopulmonary findings. Electronically Signed   By: Rudie Meyer M.D.   On: 09/12/2021 13:45    Procedures Procedures (including critical care time)  Medications Ordered in UC Medications - No data to display  Initial Impression / Assessment and Plan / UC Course  I have reviewed the triage vital signs and the nursing notes.  Pertinent labs &  imaging results that were available during my care of the patient were reviewed by me and considered in my medical decision making (see chart for details).   Cough, SOB.  Patient is well-appearing and her exam is reassuring.  Lungs are clear and O2 sat is 95% on room air.  CXR normal.  No indication for  antibiotic at this time.  Patient declines COVID or flu test. Treating with prednisone taper.  Patient declines albuterol inhaler or Tessalon.   Instructed her to follow up with her PCP if her symptoms are not improving.  She agrees to plan of care.     Final Clinical Impressions(s) / UC Diagnoses   Final diagnoses:  Acute cough  Shortness of breath     Discharge Instructions      Take the prednisone as directed.  Follow up with your primary care provider if your symptoms are not improving.         ED Prescriptions     Medication Sig Dispense Auth. Provider   predniSONE (STERAPRED UNI-PAK 21 TAB) 10 MG (21) TBPK tablet Take by mouth daily. As directed 21 tablet Mickie Bail, NP      PDMP not reviewed this encounter.   Mickie Bail, NP 09/12/21 [phone]

## 2022-04-05 ENCOUNTER — Other Ambulatory Visit: Payer: Self-pay | Admitting: Internal Medicine

## 2022-04-05 DIAGNOSIS — Z1231 Encounter for screening mammogram for malignant neoplasm of breast: Secondary | ICD-10-CM

## 2022-04-26 ENCOUNTER — Ambulatory Visit
Admission: RE | Admit: 2022-04-26 | Discharge: 2022-04-26 | Disposition: A | Discharge status: Home / Self Care | Payer: BC Managed Care – PPO | Source: Ambulatory Visit | Attending: Internal Medicine | Admitting: Internal Medicine

## 2022-04-26 DIAGNOSIS — Z1231 Encounter for screening mammogram for malignant neoplasm of breast: Secondary | ICD-10-CM | POA: Insufficient documentation

## 2022-04-27 ENCOUNTER — Inpatient Hospital Stay
Admission: RE | Admit: 2022-04-27 | Discharge: 2022-04-27 | Disposition: A | Discharge status: Home / Self Care | Payer: Self-pay | Source: Ambulatory Visit | Attending: *Deleted | Admitting: *Deleted

## 2022-04-27 ENCOUNTER — Other Ambulatory Visit: Payer: Self-pay | Admitting: *Deleted

## 2022-04-27 DIAGNOSIS — Z1231 Encounter for screening mammogram for malignant neoplasm of breast: Secondary | ICD-10-CM

## 2022-05-01 ENCOUNTER — Inpatient Hospital Stay
Admission: RE | Admit: 2022-05-01 | Discharge: 2022-05-01 | Disposition: A | Discharge status: Home / Self Care | Payer: Self-pay | Source: Ambulatory Visit | Attending: *Deleted | Admitting: *Deleted

## 2022-05-01 ENCOUNTER — Other Ambulatory Visit: Payer: Self-pay | Admitting: *Deleted

## 2022-05-01 DIAGNOSIS — Z1231 Encounter for screening mammogram for malignant neoplasm of breast: Secondary | ICD-10-CM

## 2022-10-02 ENCOUNTER — Other Ambulatory Visit: Payer: Self-pay | Admitting: Gastroenterology

## 2022-10-02 DIAGNOSIS — R1013 Epigastric pain: Secondary | ICD-10-CM

## 2022-10-02 DIAGNOSIS — R1319 Other dysphagia: Secondary | ICD-10-CM

## 2022-10-02 DIAGNOSIS — R6881 Early satiety: Secondary | ICD-10-CM

## 2022-10-10 ENCOUNTER — Ambulatory Visit
Admission: RE | Admit: 2022-10-10 | Discharge: 2022-10-10 | Disposition: A | Discharge status: Home / Self Care | Payer: BC Managed Care – PPO | Source: Ambulatory Visit | Attending: Gastroenterology | Admitting: Gastroenterology

## 2022-10-10 DIAGNOSIS — R1319 Other dysphagia: Secondary | ICD-10-CM | POA: Insufficient documentation

## 2022-10-10 DIAGNOSIS — R6881 Early satiety: Secondary | ICD-10-CM | POA: Diagnosis present

## 2022-10-10 DIAGNOSIS — R1013 Epigastric pain: Secondary | ICD-10-CM | POA: Diagnosis not present

## 2022-10-11 ENCOUNTER — Other Ambulatory Visit: Payer: Self-pay | Admitting: Gastroenterology

## 2022-10-11 DIAGNOSIS — R1013 Epigastric pain: Secondary | ICD-10-CM

## 2022-10-11 DIAGNOSIS — R6881 Early satiety: Secondary | ICD-10-CM

## 2022-10-16 ENCOUNTER — Other Ambulatory Visit: Payer: Self-pay | Admitting: Gastroenterology

## 2022-10-16 ENCOUNTER — Encounter
Admission: RE | Admit: 2022-10-16 | Discharge: 2022-10-16 | Disposition: A | Discharge status: Home / Self Care | Payer: BC Managed Care – PPO | Source: Ambulatory Visit | Attending: Gastroenterology | Admitting: Gastroenterology

## 2022-10-16 DIAGNOSIS — R6881 Early satiety: Secondary | ICD-10-CM | POA: Insufficient documentation

## 2022-10-16 DIAGNOSIS — R1013 Epigastric pain: Secondary | ICD-10-CM | POA: Insufficient documentation

## 2022-10-16 MED ORDER — TECHNETIUM TC 99M MEBROFENIN IV KIT
5.1600 | PACK | Freq: Once | INTRAVENOUS | Status: AC | PRN
  Administered 2022-10-16: 5.16 via INTRAVENOUS

## 2022-10-22 ENCOUNTER — Other Ambulatory Visit: Payer: Self-pay | Admitting: Gastroenterology

## 2022-10-22 DIAGNOSIS — R1319 Other dysphagia: Secondary | ICD-10-CM

## 2022-10-22 DIAGNOSIS — R6881 Early satiety: Secondary | ICD-10-CM

## 2022-10-22 DIAGNOSIS — R1013 Epigastric pain: Secondary | ICD-10-CM

## 2022-11-05 ENCOUNTER — Ambulatory Visit (HOSPITAL_COMMUNITY)
Admission: RE | Admit: 2022-11-05 | Discharge: 2022-11-05 | Disposition: A | Discharge status: Home / Self Care | Payer: BC Managed Care – PPO | Source: Ambulatory Visit | Attending: Gastroenterology | Admitting: Gastroenterology

## 2022-11-05 DIAGNOSIS — R1319 Other dysphagia: Secondary | ICD-10-CM | POA: Diagnosis present

## 2022-11-05 DIAGNOSIS — R1013 Epigastric pain: Secondary | ICD-10-CM

## 2022-11-05 DIAGNOSIS — R6881 Early satiety: Secondary | ICD-10-CM | POA: Insufficient documentation

## 2023-03-18 ENCOUNTER — Ambulatory Visit (HOSPITAL_COMMUNITY)
Admission: EM | Admit: 2023-03-18 | Discharge: 2023-03-18 | Disposition: A | Discharge status: Home / Self Care | Payer: BC Managed Care – PPO

## 2023-05-21 ENCOUNTER — Other Ambulatory Visit: Payer: Self-pay | Admitting: Internal Medicine

## 2023-05-21 DIAGNOSIS — E782 Mixed hyperlipidemia: Secondary | ICD-10-CM

## 2023-05-24 ENCOUNTER — Ambulatory Visit (HOSPITAL_COMMUNITY)
Admission: RE | Admit: 2023-05-24 | Discharge: 2023-05-24 | Disposition: A | Discharge status: Home / Self Care | Payer: BC Managed Care – PPO | Source: Ambulatory Visit | Attending: Internal Medicine | Admitting: Internal Medicine

## 2023-05-24 DIAGNOSIS — E782 Mixed hyperlipidemia: Secondary | ICD-10-CM | POA: Insufficient documentation

## 2023-08-06 ENCOUNTER — Ambulatory Visit: Payer: BC Managed Care – PPO | Admitting: Orthopedic Surgery

## 2023-08-06 DIAGNOSIS — S86302A Unspecified injury of muscle(s) and tendon(s) of peroneal muscle group at lower leg level, left leg, initial encounter: Secondary | ICD-10-CM

## 2023-08-06 DIAGNOSIS — M205X2 Other deformities of toe(s) (acquired), left foot: Secondary | ICD-10-CM

## 2023-08-07 ENCOUNTER — Encounter: Payer: Self-pay | Admitting: Orthopedic Surgery

## 2023-08-07 NOTE — Progress Notes (Signed)
Office Visit Note   Patient: Rebecca Berger           Date of Birth: [birth year]           MRN: [id number] Visit Date: 08/06/2023              Requested by: Enid Baas, MD [address] [place],  [place] [address] PCP: Enid Baas, MD  Chief Complaint  Patient presents with   Left Ankle - Pain   Left Foot - Pain      HPI: Patient is a 67 year old woman who is seen for initial evaluation for her left foot and ankle.  Patient states that 4 years ago she was told she had a peroneal tendon tear was recommended reconstruction surgery.  Patient was then seen at [facility] 2 years ago and was told that she needed peroneal tendon reconstruction surgery.  She states that recently she has been told by [facility] that she does not need surgery.  Patient states that she has had progressive clawing of the second and third toes left foot.  Assessment & Plan: Visit Diagnoses:  1. Claw toe, acquired, left   2. Injury of peroneal tendon of left foot, initial encounter     Plan: Patient's peroneal tendon is intact with good strength and function.  No indication for Ender intervention.  Patient does have early clawing of the second and third toes left foot.  Recommended fascial strengthening.  Discussed that if the clawing becomes more severe and symptomatic we could proceed with a Weil osteotomy with possible plantar plate repair possible PIP resection and flexor tendon transfer.  Follow-Up Instructions: No follow-ups on file.   Ortho Exam  Patient is alert, oriented, no adenopathy, well-dressed, normal affect, normal respiratory effort. Examination patient has a good dorsalis pedis pulse she has good ankle good subtalar motion.  There is no swelling or tenderness to palpation over the peroneal tendons.  Patient has strong eversion with no evidence of peroneal weakness.  There is no pain to palpation along the peroneal tendons or insertion of the peroneus brevis.  Patient does have early  clawing of the second and third toes.  The plantar plate and metatarsal heads are nontender to palpation.  Imaging: No results found. No images are attached to the encounter.  Labs: Lab Results  Component Value Date   ESRSEDRATE 9 01/21/2008   ESRSEDRATE 2 12/13/2006     Lab Results  Component Value Date   ALBUMIN 4.1 11/10/2008   ALBUMIN 4.0 11/25/2007    No results found for: "MG" No results found for: "VD25OH"  No results found for: "PREALBUMIN"    Latest Ref Rng & Units 11/25/2007    9:29 AM 04/11/2007   11:58 AM 12/13/2006   10:05 AM  CBC EXTENDED  WBC 4.5 - 10.5 10*3/microliter 4.5  6.2  6.8   RBC 3.87 - 5.11 M/uL 4.60  4.78  4.54   Hemoglobin 12.0 - 15.0 g/dL 62.1  30.8  65.7   HCT 36.0 - 46.0 % 40.5  41.9  40.6   Platelets 150 - 400 K/uL 207  195  206   NEUT# 1.4 - 7.7 K/uL 2.4   4.2      There is no height or weight on file to calculate BMI.  Orders:  No orders of the defined types were placed in this encounter.  No orders of the defined types were placed in this encounter.    Procedures: No procedures performed  Clinical Data:  No additional findings.  ROS:  All other systems negative, except as noted in the HPI. Review of Systems  Objective: Vital Signs: There were no vitals taken for this visit.  Specialty Comments:  No specialty comments available.  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 No pertinent family history.  Past Surgical History:  Procedure Laterality Date   ABDOMINAL HYSTERECTOMY     ANKLE SURGERY Right 2011   BREAST LUMPECTOMY Left 2006   2005-2010 neg surgical bx   SHOULDER SURGERY Right 2016   TONSILLECTOMY     Social History   Occupational History   Not on file  Tobacco Use   Smoking status: Never   Smokeless tobacco: Never  Vaping Use   Vaping status: Never Used  Substance and Sexual Activity   Alcohol use: Yes   Drug use: Never   Sexual activity: Not on file

## 2023-08-09 ENCOUNTER — Other Ambulatory Visit: Payer: Self-pay | Admitting: Medical Genetics

## 2023-08-09 DIAGNOSIS — Z006 Encounter for examination for normal comparison and control in clinical research program: Secondary | ICD-10-CM

## 2023-09-03 ENCOUNTER — Encounter (HOSPITAL_COMMUNITY): Payer: Self-pay | Admitting: *Deleted

## 2023-09-03 ENCOUNTER — Ambulatory Visit (HOSPITAL_COMMUNITY)
Admission: EM | Admit: 2023-09-03 | Discharge: 2023-09-03 | Disposition: A | Discharge status: Home / Self Care | Payer: BC Managed Care – PPO | Attending: Family Medicine | Admitting: Family Medicine

## 2023-09-03 ENCOUNTER — Ambulatory Visit (INDEPENDENT_AMBULATORY_CARE_PROVIDER_SITE_OTHER): Payer: BC Managed Care – PPO

## 2023-09-03 DIAGNOSIS — H6993 Unspecified Eustachian tube disorder, bilateral: Secondary | ICD-10-CM

## 2023-09-03 DIAGNOSIS — J069 Acute upper respiratory infection, unspecified: Secondary | ICD-10-CM | POA: Diagnosis not present

## 2023-09-03 MED ORDER — METHYLPREDNISOLONE 4 MG PO TBPK: ORAL_TABLET | ORAL | 0 refills | Status: DC

## 2023-09-03 NOTE — Discharge Instructions (Signed)
You were seen today for upper respiratory symptoms.  Your chest xray appears normal.  If the radiologist reads this differently we will call to notify you.  I have sent out a steroid pack to help with both chest congestion and fluid in the ears.  I recommend you take zyrtec or claritin as well to help.  Please return if not improving or worsening.

## 2023-09-03 NOTE — ED Provider Notes (Signed)
[facility]    CSN: [id number] Arrival date & time: 09/03/23  1054      History   Chief Complaint Chief Complaint  Patient presents with   Otalgia   Sore Throat   Cough   Nasal Congestion    HPI Rebecca Berger is a 67 y.o. female.    Otalgia Associated symptoms: cough and sore throat   Associated symptoms: no congestion   Sore Throat  Cough Associated symptoms: ear pain and sore throat    Patient is here for URI symptoms.  Having a cough x 4 days, and worsening.  Having a sore throat, left ear pain that comes/goes.  No runny nose, congestion.  When she coughs she has a hard time catching her breath, otherwise not sob.  No fevers.  She did use a natural cough medication with some help.       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      Home Medications    Prior to Admission medications   Medication Sig Start Date End Date Taking?  Authorizing Provider  estradiol (VIVELLE-DOT) 0.05 MG/24HR patch estradiol 0.05 mg/24 hr semiweekly transdermal patch 12/10/19  Yes [provider]  buPROPion (WELLBUTRIN XL) 300 MG 24 hr tablet bupropion HCl XL 300 mg 24 hr tablet, extended release  TAKE 1 TABLET BY MOUTH EVERY DAY 05/17/20   [provider]  diphenhydrAMINE (BENADRYL) 50 MG capsule Take by mouth.    [provider]  EPINEPHrine 0.3 mg/0.3 mL IJ SOAJ injection Inject into the skin. 01/13/18   [provider]  predniSONE (STERAPRED UNI-PAK 21 TAB) 10 MG (21) TBPK tablet Take by mouth daily. As directed 09/12/21   Mickie Bail, NP    Family History History reviewed. No pertinent family history.  Social History Social History   Tobacco Use   Smoking status: Never   Smokeless tobacco: Never  Vaping Use   Vaping status: Never Used  Substance Use Topics   Alcohol use: Yes   Drug use: Never     Allergies   Blue dyes (parenteral), Other, Oxycodone, Red dye #40 (allura red), Sodium nitrite, Sulfites, Wound dressing adhesive, Yellow dyes (non-tartrazine), Amoxicillin, Atorvastatin, Erythromycin, Gluten meal, Hydrocodone-acetaminophen, Milk-related compounds, Morphine, Sulfonamide derivatives, Tetracycline, Silicone, and Sulfacetamide sodium   Review of Systems Review of Systems  Constitutional: Negative.   HENT:  Positive for ear pain and sore throat. Negative  for congestion.   Respiratory:  Positive for cough.   Cardiovascular: Negative.   Gastrointestinal: Negative.   Musculoskeletal: Negative.   Psychiatric/Behavioral: Negative.       Physical Exam Triage Vital Signs ED Triage Vitals  Encounter Vitals Group     BP 09/03/23 1142 (!) 149/90     Systolic BP Percentile --      Diastolic BP Percentile --      Pulse Rate 09/03/23 1142 80     Resp 09/03/23 1142 20     Temp 09/03/23 1142 98.5 F (36.9 C)     Temp Source 09/03/23 1142 Oral     SpO2 09/03/23 1142 95 %      Weight --      Height --      Head Circumference --      Peak Flow --      Pain Score 09/03/23 1138 4     Pain Loc --      Pain Education --      Exclude from Growth Chart --    No data found.  Updated Vital Signs BP (!) 149/90 (BP Location: Left Arm)   Pulse 80   Temp 98.5 F (36.9 C) (Oral)   Resp 20   SpO2 95%   Visual Acuity Right Eye Distance:   Left Eye Distance:   Bilateral Distance:    Right Eye Near:   Left Eye Near:    Bilateral Near:     Physical Exam Constitutional:      General: She is not in acute distress.    Appearance: Normal appearance. She is not ill-appearing or toxic-appearing.  HENT:     Right Ear: A middle ear effusion is present. Tympanic membrane is not erythematous.     Left Ear: A middle ear effusion is present. Tympanic membrane is not erythematous.     Nose:     Right Sinus: No maxillary sinus tenderness or frontal sinus tenderness.     Left Sinus: No maxillary sinus tenderness or frontal sinus tenderness.  Cardiovascular:     Rate and Rhythm: Normal rate and regular rhythm.  Pulmonary:     Effort: Pulmonary effort is normal.     Breath sounds: Wheezing present.     Comments: Mild scattered expiratory wheezes Musculoskeletal:     Cervical back: Normal range of motion and neck supple. No tenderness.  Lymphadenopathy:     Cervical: No cervical adenopathy.  Skin:    General: Skin is warm.  Neurological:     General: No focal deficit present.     Mental Status: She is alert.  Psychiatric:        Mood and Affect: Mood normal.      UC Treatments / Results  Labs (all labs ordered are listed, but only abnormal results are displayed) Labs Reviewed - No data to display  EKG   Radiology No results found.  Procedures Procedures (including critical care time)  Medications Ordered in UC Medications - No data to display  Initial Impression / Assessment and Plan / UC Course  I have reviewed the triage vital signs and the  nursing notes.  Pertinent labs & imaging results that were available during my care of the patient were reviewed by me and considered in my medical decision making (see chart for details).   Final Clinical Impressions(s) / UC Diagnoses   Final diagnoses:  Eustachian tube dysfunction, bilateral  Upper respiratory tract infection, unspecified type     Discharge Instructions  You were seen today for upper respiratory symptoms.  Your chest xray appears normal.  If the radiologist reads this differently we will call to notify you.  I have sent out a steroid pack to help with both chest congestion and fluid in the ears.  I recommend you take zyrtec or claritin as well to help.  Please return if not improving or worsening.     ED Prescriptions     Medication Sig Dispense Auth. Provider   methylPREDNISolone (MEDROL DOSEPAK) 4 MG TBPK tablet Take as directed 1 each Jannifer Franklin, MD      PDMP not reviewed this encounter.   Jannifer Franklin, MD 09/03/23 1227

## 2023-09-03 NOTE — ED Triage Notes (Signed)
Pt states she has had cough, congestion, sore throat and ear pain X 4 days. She is taking all natural meds she got at [employer] due to allergies she is unsure of what to take.

## 2023-09-09 ENCOUNTER — Ambulatory Visit (INDEPENDENT_AMBULATORY_CARE_PROVIDER_SITE_OTHER): Payer: BC Managed Care – PPO

## 2023-09-09 ENCOUNTER — Encounter (HOSPITAL_COMMUNITY): Payer: Self-pay

## 2023-09-09 ENCOUNTER — Ambulatory Visit (HOSPITAL_COMMUNITY)
Admission: EM | Admit: 2023-09-09 | Discharge: 2023-09-09 | Disposition: A | Discharge status: Home / Self Care | Payer: BC Managed Care – PPO | Attending: Family Medicine | Admitting: Family Medicine

## 2023-09-09 DIAGNOSIS — R051 Acute cough: Secondary | ICD-10-CM

## 2023-09-09 DIAGNOSIS — J019 Acute sinusitis, unspecified: Secondary | ICD-10-CM

## 2023-09-09 MED ORDER — ALBUTEROL SULFATE HFA 108 (90 BASE) MCG/ACT IN AERS
2.0000 | INHALATION_SPRAY | RESPIRATORY_TRACT | 0 refills | Status: DC | PRN
Start: 2023-09-09 — End: 2024-07-03

## 2023-09-09 MED ORDER — PROMETHAZINE HCL 12.5 MG PO TABS: 12.5000 mg | ORAL_TABLET | Freq: Three times a day (TID) | ORAL | 0 refills | Status: DC | PRN

## 2023-09-09 MED ORDER — LEVOFLOXACIN 750 MG PO TABS
750.0000 mg | ORAL_TABLET | Freq: Every day | ORAL | 0 refills | Status: DC
Start: 2023-09-09 — End: 2024-07-03

## 2023-09-09 NOTE — ED Triage Notes (Addendum)
Pt presents with cough and nasal congestion for approximately one week. Pt was seen here on 12/3, prescribed steroid pack and told to take OTC medications. Pt states she did finish the steroid pack & has taken "Mucinex Nightshift, and two [facility] decongestants" with no improvement in symptoms. Pt did take an at home COVID test yesterday and it was negative. Pt was concerned as she had lost her sense of smell. Pt reports throat discomfort but denies ear pain.

## 2023-09-09 NOTE — Discharge Instructions (Addendum)
Levaquin 750 mg--take 1 tablet daily for 7 days  Promethazine 12.5 mg tablets--1 to 2 tablets by mouth every 8 hours as needed for cough.  This has traditionally been used for nausea in the past, but is also a good cough suppressant.  It can make you dizzy or sleepy.

## 2023-09-09 NOTE — ED Provider Notes (Signed)
[facility]    CSN: [id number] Arrival date & time: 09/09/23  1115      History   Chief Complaint Chief Complaint  Patient presents with   Cough   Nasal Congestion    HPI Rebecca Berger is a 67 y.o. female.    Cough Here with cough and sore throat x 9-10 days. Seen here 12/3, exam normal, and proved with a steroid pack rx. She lost her sense of smell, so did a home covid test yesterday that was negative. No ear pain now  The cough has become more intense and is not letting her rest. No fever She is not as short of breath as she is feeling tired.  She is having some sinus pressure bilaterally.  She has an extensive allergy list, which includes amoxicillin, erythromycin, and tetracycline, she does not recall what her reaction to the amoxicillin was, though it could have been stomach pain.  She states if anything has any dyes or colors in it she does not tolerate it.  Codeine and hydrocodone are not tolerated either.  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      Home Medications    Prior to Admission medications   Medication Sig Start Date End Date Taking? Authorizing Provider  albuterol (VENTOLIN HFA) 108 (90 Base) MCG/ACT inhaler Inhale 2 puffs into the lungs every 4 (four) hours as needed for wheezing or shortness of breath. 09/09/23  Yes Zenia Resides, MD  levofloxacin (LEVAQUIN) 750 MG tablet Take 1 tablet (750 mg total) by mouth daily. 09/09/23  Yes Zenia Resides, MD  promethazine (PHENERGAN) 12.5 MG tablet Take 1-2 tablets (12.5-25 mg total) by mouth every 8 (eight) hours as needed (cough). 09/09/23  Yes Zenia Resides, MD  EPINEPHrine 0.3 mg/0.3 mL IJ SOAJ injection Inject into the skin. 01/13/18   [provider]  estradiol (VIVELLE-DOT) 0.05 MG/24HR patch estradiol 0.05 mg/24 hr semiweekly transdermal patch 12/10/19   [provider]    Family History History reviewed. No pertinent family history.  Social History Social History   Tobacco Use   Smoking status: Never   Smokeless tobacco: Never  Vaping Use   Vaping status: Never Used  Substance Use Topics   Alcohol use: Yes   Drug use: Never     Allergies   Blue dyes (parenteral), Other, Oxycodone, Red dye #40 (allura red), Sodium nitrite, Sulfites,  Wound dressing adhesive, Yellow dyes (non-tartrazine), Amoxicillin, Atorvastatin, Erythromycin, Gluten meal, Hydrocodone-acetaminophen, Milk-related compounds, Morphine, Sulfonamide derivatives, Tetracycline, Silicone, and Sulfacetamide sodium   Review of Systems Review of Systems  Respiratory:  Positive for cough.      Physical Exam Triage Vital Signs ED Triage Vitals  Encounter Vitals Group     BP 09/09/23 1224 136/84     Systolic BP Percentile --      Diastolic BP Percentile --      Pulse Rate 09/09/23 1224 86     Resp 09/09/23 1224 18     Temp 09/09/23 1224 98 F (36.7 C)     Temp  Source 09/09/23 1224 Oral     SpO2 09/09/23 1224 94 %     Weight --      Height 09/09/23 1222 5\' 9"  (1.753 m)     Head Circumference --      Peak Flow --      Pain Score 09/09/23 1221 0     Pain Loc --      Pain Education --      Exclude from Growth Chart --    No data found.  Updated Vital Signs BP 136/84 (BP Location: Left Arm)   Pulse 86   Temp 98 F (36.7 C) (Oral)   Resp 18   Ht 5\' 9"  (1.753 m)   SpO2 94%   BMI 26.58 kg/m   Visual Acuity Right Eye Distance:   Left Eye Distance:   Bilateral Distance:    Right Eye Near:   Left Eye Near:    Bilateral Near:     Physical Exam Vitals reviewed.  Constitutional:      General: She is not in acute distress.    Appearance: She is not ill-appearing, toxic-appearing or diaphoretic.  HENT:     Nose: Nose normal.     Mouth/Throat:     Mouth: Mucous membranes are moist.     Pharynx: No oropharyngeal exudate or posterior oropharyngeal erythema.  Eyes:     Extraocular Movements: Extraocular movements intact.     Conjunctiva/sclera: Conjunctivae normal.     Pupils: Pupils are equal, round, and reactive to light.  Cardiovascular:     Rate and Rhythm: Normal rate and regular rhythm.     Heart sounds: No murmur heard. Pulmonary:     Effort: No respiratory distress.     Breath sounds: No stridor.     Comments: There are some low pitched wheezes or rhonchi heard in the upper and lower lung fields bilaterally. Musculoskeletal:     Cervical back: Neck supple.  Lymphadenopathy:     Cervical: No cervical adenopathy.  Skin:    Capillary Refill: Capillary refill takes less than 2 seconds.     Coloration: Skin is not jaundiced or pale.  Neurological:     General: No focal deficit present.     Mental Status: She is alert and oriented to person, place, and time.  Psychiatric:        Behavior: Behavior normal.      UC Treatments / Results  Labs (all labs ordered are listed, but only abnormal results are displayed) Labs  Reviewed - No data to display  EKG   Radiology No results found.  Procedures Procedures (including critical care time)  Medications Ordered in UC Medications - No data to display  Initial Impression / Assessment and Plan / UC Course  I have reviewed the triage vital signs and the nursing notes.  Pertinent labs &  imaging results that were available during my care of the patient were reviewed by me and considered in my medical decision making (see chart for details).     Chest x-ray by my review does not show any infiltrate.  She is advised of radiology overread.  Levaquin is sent in to treat probable sinusitis.  Promethazine tablets are sent in as a cough suppressant.  Phenergan with dextromethorphan as coloring and flavorings added. She cannot take codiene. She states tessalon perles she cannot take, since they are yellow  Albuterol inhaler is also sent since I heard some possible wheezing.  She had no improvement with the steroids, so that was not sent in. Final Clinical Impressions(s) / UC Diagnoses   Final diagnoses:  Acute sinusitis, recurrence not specified, unspecified location  Acute cough     Discharge Instructions      Levaquin 750 mg--take 1 tablet daily for 7 days  Promethazine 12.5 mg tablets--1 to 2 tablets by mouth every 8 hours as needed for cough.  This has traditionally been used for nausea in the past, but is also a good cough suppressant.  It can make you dizzy or sleepy.      ED Prescriptions     Medication Sig Dispense Auth. Provider   levofloxacin (LEVAQUIN) 750 MG tablet Take 1 tablet (750 mg total) by mouth daily. 7 tablet Nachman Sundt, Janace Aris, MD   promethazine (PHENERGAN) 12.5 MG tablet Take 1-2 tablets (12.5-25 mg total) by mouth every 8 (eight) hours as needed (cough). 20 tablet Deshawnda Acrey, Janace Aris, MD   albuterol (VENTOLIN HFA) 108 (90 Base) MCG/ACT inhaler Inhale 2 puffs into the lungs every 4 (four) hours as needed for wheezing or  shortness of breath. 1 each Zenia Resides, MD      PDMP not reviewed this encounter.   Zenia Resides, MD 09/09/23 1344

## 2023-09-13 ENCOUNTER — Other Ambulatory Visit (HOSPITAL_COMMUNITY)
Admission: RE | Admit: 2023-09-13 | Discharge: 2023-09-13 | Disposition: A | Discharge status: Home / Self Care | Payer: Self-pay | Source: Ambulatory Visit | Attending: Oncology | Admitting: Oncology

## 2023-09-13 DIAGNOSIS — Z006 Encounter for examination for normal comparison and control in clinical research program: Secondary | ICD-10-CM

## 2023-09-23 LAB — GENECONNECT MOLECULAR SCREEN: Genetic Analysis Overall Interpretation: NEGATIVE

## 2023-10-17 ENCOUNTER — Other Ambulatory Visit: Payer: Self-pay | Admitting: Internal Medicine

## 2023-10-17 DIAGNOSIS — Z1231 Encounter for screening mammogram for malignant neoplasm of breast: Secondary | ICD-10-CM

## 2023-11-20 ENCOUNTER — Ambulatory Visit
Admission: RE | Admit: 2023-11-20 | Discharge: 2023-11-20 | Disposition: A | Discharge status: Home / Self Care | Payer: BC Managed Care – PPO | Source: Ambulatory Visit | Attending: Internal Medicine | Admitting: Internal Medicine

## 2023-11-20 DIAGNOSIS — Z1231 Encounter for screening mammogram for malignant neoplasm of breast: Secondary | ICD-10-CM | POA: Insufficient documentation

## 2024-03-15 ENCOUNTER — Ambulatory Visit (HOSPITAL_COMMUNITY)

## 2024-03-31 ENCOUNTER — Ambulatory Visit: Admitting: Orthopedic Surgery

## 2024-07-03 ENCOUNTER — Emergency Department (HOSPITAL_COMMUNITY)
Admission: EM | Admit: 2024-07-03 | Discharge: 2024-07-03 | Disposition: A | Discharge status: Home / Self Care | Attending: Emergency Medicine | Admitting: Emergency Medicine

## 2024-07-03 ENCOUNTER — Emergency Department (HOSPITAL_COMMUNITY)

## 2024-07-03 ENCOUNTER — Encounter (HOSPITAL_COMMUNITY): Payer: Self-pay

## 2024-07-03 ENCOUNTER — Emergency Department (EMERGENCY_DEPARTMENT_HOSPITAL)

## 2024-07-03 DIAGNOSIS — H532 Diplopia: Secondary | ICD-10-CM

## 2024-07-03 DIAGNOSIS — H539 Unspecified visual disturbance: Secondary | ICD-10-CM

## 2024-07-03 LAB — COMPREHENSIVE METABOLIC PANEL WITH GFR
ALT: 14 U/L (ref 0–44)
AST: 23 U/L (ref 15–41)
Albumin: 3.7 g/dL (ref 3.5–5.0)
Alkaline Phosphatase: 73 U/L (ref 38–126)
Anion gap: 10 (ref 5–15)
BUN: 7 mg/dL — ABNORMAL LOW (ref 8–23)
CO2: 19 mmol/L — ABNORMAL LOW (ref 22–32)
Calcium: 8.9 mg/dL (ref 8.9–10.3)
Chloride: 109 mmol/L (ref 98–111)
Creatinine, Ser: 0.89 mg/dL (ref 0.44–1.00)
GFR, Estimated: >60 mL/min (ref >60)
Glucose, Bld: 92 mg/dL (ref 70–99)
Potassium: 4.2 mmol/L (ref 3.5–5.1)
Sodium: 138 mmol/L (ref 135–145)
Total Bilirubin: 0.5 mg/dL (ref 0.0–1.2)
Total Protein: 6.3 g/dL — ABNORMAL LOW (ref 6.5–8.1)

## 2024-07-03 LAB — CBC
HCT: 48.3 % — ABNORMAL HIGH (ref 36.0–46.0)
Hemoglobin: 15.8 g/dL — ABNORMAL HIGH (ref 12.0–15.0)
MCH: 28.9 pg (ref 26.0–34.0)
MCHC: 32.7 g/dL (ref 30.0–36.0)
MCV: 88.3 fL (ref 80.0–100.0)
Platelets: 206 10*3/uL (ref 150–400)
RBC: 5.47 MIL/uL — ABNORMAL HIGH (ref 3.87–5.11)
RDW: 12.3 % (ref 11.5–15.5)
WBC: 5.7 10*3/uL (ref 4.0–10.5)
nRBC: 0 % (ref 0.0–0.2)

## 2024-07-03 LAB — ETHANOL: Alcohol, Ethyl (B): <15 mg/dL

## 2024-07-03 LAB — DIFFERENTIAL
Abs Immature Granulocytes: 0.05 10*3/uL (ref 0.00–0.07)
Basophils Absolute: 0.1 10*3/uL (ref 0.0–0.1)
Basophils Relative: 2 %
Eosinophils Absolute: 0.2 10*3/uL (ref 0.0–0.5)
Eosinophils Relative: 3 %
Immature Granulocytes: 1 %
Lymphocytes Relative: 22 %
Lymphs Abs: 1.2 10*3/uL (ref 0.7–4.0)
Monocytes Absolute: 0.6 10*3/uL (ref 0.1–1.0)
Monocytes Relative: 11 %
Neutro Abs: 3.5 10*3/uL (ref 1.7–7.7)
Neutrophils Relative %: 61 %

## 2024-07-03 LAB — I-STAT CHEM 8, ED
BUN: 10 mg/dL (ref 8–23)
Calcium, Ion: 0.87 mmol/L — CL (ref 1.15–1.40)
Chloride: 112 mmol/L — ABNORMAL HIGH (ref 98–111)
Creatinine, Ser: 0.9 mg/dL (ref 0.44–1.00)
Glucose, Bld: 95 mg/dL (ref 70–99)
HCT: 46 % (ref 36.0–46.0)
Hemoglobin: 15.6 g/dL — ABNORMAL HIGH (ref 12.0–15.0)
Potassium: 4.6 mmol/L (ref 3.5–5.1)
Sodium: 136 mmol/L (ref 135–145)
TCO2: 19 mmol/L — ABNORMAL LOW (ref 22–32)

## 2024-07-03 LAB — PROTIME-INR
INR: 1 (ref 0.8–1.2)
Prothrombin Time: 13.3 s (ref 11.4–15.2)

## 2024-07-03 LAB — RAPID URINE DRUG SCREEN, HOSP PERFORMED
Amphetamines: NOT DETECTED
Barbiturates: NOT DETECTED
Benzodiazepines: NOT DETECTED
Cocaine: NOT DETECTED
Opiates: NOT DETECTED
Tetrahydrocannabinol: NOT DETECTED

## 2024-07-03 LAB — APTT: aPTT: 30 s (ref 24–36)

## 2024-07-03 MED ORDER — GADOBUTROL 1 MMOL/ML IV SOLN
8.2000 mL | Freq: Once | INTRAVENOUS | Status: AC | PRN
  Administered 2024-07-03: 8.2 mL via INTRAVENOUS

## 2024-07-03 MED ORDER — IOHEXOL 350 MG/ML SOLN
75.0000 mL | Freq: Once | INTRAVENOUS | Status: AC | PRN
  Administered 2024-07-03: 75 mL via INTRAVENOUS

## 2024-07-03 NOTE — ED Provider Notes (Signed)
 Patient care was taken over from Dr. Doretha.  Patient had an episode this morning of double vision.  She felt like one of her eye was deviated.  This only lasted about 3 to 4 minutes.  She has not had any further symptoms.  No associated headache.  MRI of the brain and orbits does not show any acute abnormalities.  Discussed with Dr. Lindzen with neurology who has reviewed the MRIs.  He feels that patient can be discharged home.  Does not feel like it would be concerning for a TIA/stroke.  Patient was discharged home in good condition.  He recommended having her start a baby aspirin.  She will have close follow-up with her PCP.  Return precautions were given.   Lenor Hollering, MD 07/03/24 [phone]

## 2024-07-03 NOTE — ED Provider Notes (Signed)
 [place] EMERGENCY DEPARTMENT AT [facility] Provider Note   CSN: [id number] Arrival date & time: 07/03/24  9080     Patient presents with: Neurologic Problem   Rebecca Berger is a 68 y.o. female.   Patient is a 68 year old female with history of Sjogren syndrome, frequent migraines and multiple allergies who is presenting today with complaints of sudden onset of double vision in her right eye, dizziness and difficult time walking.  Patient reports that yesterday was a normal day.  When she woke up this morning she felt okay and about 830 when she was sitting at the computer she suddenly reports her eye felt strange like it was twisting in its socket and she was having double vision.  She reports that she was seeing double next to each other and it would resolve if she closed her right eye.  She initially tried to get up and walk and could not with both eyes open but when she closed her right eye she was able to go downstairs.  She also reported some nausea related to it and states the symptoms lasted about 3 to 5 minutes and then resolved.  She reports that her vision seems okay now.  Denies any loss of vision in the eye.  She is no longer seeing double vision.  Denies any eye pain.  She does report a mild headache now but does not feel like a migraine.  She does report getting auras with her other migraines where she will lose vision completely but she has never had anything like this before.  She went to tell her neighbor who brought her to the hospital for evaluation.  He denied seeing any facial droops or slurred speech.  Patient denies any weakness or numbness on one side of her body.  She denies taking any medications regularly has no history of diabetes or high blood pressure.  The history is provided by the patient.  Neurologic Problem       Prior to Admission medications   Medication Sig Start Date End Date Taking? Authorizing Provider  EPINEPHrine 0.3 mg/0.3 mL IJ SOAJ  injection Inject into the skin. 01/13/18  Yes [provider]  estradiol (VIVELLE-DOT) 0.1 MG/24HR patch Place 1 patch onto the skin 2 (two) times a week.    [provider]    Allergies: Blue dyes (parenteral), Other, Oxycodone, Red dye #40 (allura red), Sodium nitrite, Sulfites, Wound dressing adhesive, Yellow dyes (non-tartrazine), Amoxicillin, Atorvastatin, Erythromycin, Hydrocodone-acetaminophen, Milk-related compounds, Tetracycline, Morphine, Silicone, Sulfacetamide sodium, and Sulfonamide derivatives    Review of Systems  Updated Vital Signs BP 132/88   Pulse 65   Temp 97.8 F (36.6 C) (Oral)   Resp 14   SpO2 100%   Physical Exam Vitals and nursing note reviewed.  Constitutional:      General: She is not in acute distress.    Appearance: She is well-developed.     Comments: Currently feeling a little nervous because of what happened but otherwise has no complaints  HENT:     Head: Normocephalic and atraumatic.     Mouth/Throat:     Mouth: Mucous membranes are moist.  Eyes:     General: No visual field deficit.    Extraocular Movements: Extraocular movements intact.     Conjunctiva/sclera: Conjunctivae normal.     Pupils: Pupils are equal, round, and reactive to light.  Neck:     Vascular: No carotid bruit.  Cardiovascular:     Rate and Rhythm: Normal  rate and regular rhythm.     Heart sounds: Normal heart sounds. No murmur heard.    No friction rub.  Pulmonary:     Effort: Pulmonary effort is normal.     Breath sounds: Normal breath sounds. No wheezing or rales.  Abdominal:     General: Bowel sounds are normal. There is no distension.     Palpations: Abdomen is soft.     Tenderness: There is no abdominal tenderness. There is no guarding or rebound.  Musculoskeletal:        General: No tenderness. Normal range of motion.     Cervical back: Normal range of motion and neck supple. No tenderness.     Comments: No edema  Skin:    General: Skin is  warm and dry.     Findings: No rash.  Neurological:     Mental Status: She is alert and oriented to person, place, and time. Mental status is at baseline.     Cranial Nerves: No cranial nerve deficit, dysarthria or facial asymmetry.     Sensory: Sensation is intact. No sensory deficit.     Motor: Motor function is intact. No weakness or pronator drift.     Coordination: Coordination normal. Heel to Shin Test normal.     Gait: Gait normal.     Comments: 5 out of 5 strength in all 4 extremities  Psychiatric:        Behavior: Behavior normal.     (all labs ordered are listed, but only abnormal results are displayed) Labs Reviewed  CBC - Abnormal; Notable for the following components:      Result Value   RBC 5.47 (*)    Hemoglobin 15.8 (*)    HCT 48.3 (*)    All other components within normal limits  COMPREHENSIVE METABOLIC PANEL WITH GFR - Abnormal; Notable for the following components:   CO2 19 (*)    BUN 7 (*)    Total Protein 6.3 (*)    All other components within normal limits  I-STAT CHEM 8, ED - Abnormal; Notable for the following components:   Chloride 112 (*)    Calcium, Ion 0.87 (*)    TCO2 19 (*)    Hemoglobin 15.6 (*)    All other components within normal limits  PROTIME-INR  APTT  DIFFERENTIAL  ETHANOL  RAPID URINE DRUG SCREEN, HOSP PERFORMED    EKG: EKG Interpretation Date/Time:  Friday July 03 2024 09:39:05 EDT Ventricular Rate:  57 PR Interval:  116 QRS Duration:  94 QT Interval:  423 QTC Calculation: 412 R Axis:   99  Text Interpretation: Sinus rhythm Borderline short PR interval Right axis deviation Low voltage, precordial leads No previous tracing Confirmed by Doretha Folks ([id number]) on 07/03/2024 10:39:23 AM  Radiology: CT ANGIO HEAD NECK W WO CM Result Date: 07/03/2024 EXAM: CTA HEAD AND NECK WITH AND WITHOUT 07/03/2024 10:33:37 AM TECHNIQUE: CTA of the head and neck was performed with and without the administration of 75 mL of iohexol   (OMNIPAQUE ) 350 MG/ML injection. Multiplanar 2D and/or 3D reformatted images are provided for review. Automated exposure control, iterative reconstruction, and/or weight based adjustment of the mA/kV was utilized to reduce the radiation dose to as low as reasonably achievable. Stenosis of the internal carotid arteries measured using NASCET criteria. COMPARISON: MRI of the head dated 08/25/2007. CLINICAL HISTORY: Diplopia; Stroke/TIA, determine embolic source. Pt ambulatory to triage reports double vision, dizziness and nausea that lasted approx 5 mins. Started at 082, symptoms have  resolved at this time. FINDINGS: CTA NECK: AORTIC ARCH AND ARCH VESSELS: No dissection or arterial injury. No significant stenosis of the brachiocephalic or subclavian arteries. CERVICAL CAROTID ARTERIES: No dissection, arterial injury, or hemodynamically significant stenosis by NASCET criteria. CERVICAL VERTEBRAL ARTERIES: No dissection, arterial injury, or significant stenosis. LUNGS AND MEDIASTINUM: Unremarkable. SOFT TISSUES: No acute abnormality. BONES: No acute abnormality. CTA HEAD: ANTERIOR CIRCULATION: No significant stenosis of the internal carotid arteries. No significant stenosis of the anterior cerebral arteries. No significant stenosis of the middle cerebral arteries. No aneurysm. POSTERIOR CIRCULATION: No significant stenosis of the posterior cerebral arteries. No significant stenosis of the basilar artery. No significant stenosis of the vertebral arteries. No aneurysm. OTHER: No dural venous sinus thrombosis on this non-dedicated study. IMPRESSION: 1. No large vessel occlusion, hemodynamically significant stenosis, or aneurysm in the head or neck. Electronically signed by: Evalene Coho MD 07/03/2024 10:59 AM EDT RP Workstation: [id number]     Procedures   Medications Ordered in the ED  iohexol  (OMNIPAQUE ) 350 MG/ML injection 75 mL (75 mLs Intravenous Contrast Given 07/03/24 1030)                                     Medical Decision Making Amount and/or Complexity of Data Reviewed Labs: ordered. Radiology: ordered.  Risk Prescription drug management.   Pt with multiple medical problems and comorbidities and presenting today with a complaint that caries a high risk for morbidity and mortality.  Here today with the above complaints.  Concern for TIA versus complicated migraine versus aneurysm versus cranial nerve palsy.  Low suspicion for MS, optic neuritis, temporal arteritis.  Patient did not have any loss of vision consistent with retinal artery or vein occlusion.  Pupils are reactive her eye is not red or painful at this time.  Extraocular movements appear to be intact.  Will do a TIA workup and discussed with neurology.  Patient was hypertensive here but otherwise exam is normal at this time.  Patient is complaining of a mild headache but does not want anything for it at this time.  1:13 PM I independently interpreted patient's labs and EKG.  EKG without acute findings, CBC, coags, EtOH, CMP and UDS all without acute findings.I have independently visualized and interpreted pt's images today. CTA of the head and neck without acute occlusion.  Radiology reports no large vessel occlusion or significant stenosis in the head or neck.  Findings were discussed with Dr. Lindzen with neurology who recommended MRI with and without contrast of the brain and orbits.  All this was discussed with the patient and she is willing to stay for MRI.  She remains symptom-free at this time.      Final diagnoses:  None    ED Discharge Orders     None          Doretha Folks, MD 07/03/24 1515

## 2024-07-03 NOTE — Discharge Instructions (Addendum)
 Start aching a baby aspirin daily.  Make an appointment to have close follow-up within the next few days with your primary care doctor.  Return to the emergency room if you have any worsening symptoms.

## 2024-07-03 NOTE — ED Triage Notes (Signed)
 Pt ambulatory to triage reports double vision, dizziness and nausea that lasted approx 5 mins.  Started at 082, symptoms have resolved at this time.

## 2024-08-03 ENCOUNTER — Encounter: Payer: Self-pay | Admitting: Radiology
